# Patient Record
Sex: Female | Born: 1975 | Hispanic: Yes | Marital: Single | Smoking: Never smoker
Health system: Southern US, Community
[De-identification: ages and names within clinical notes are randomized; demographics above are authoritative.]

## PROBLEM LIST (undated history)

## (undated) ENCOUNTER — Inpatient Hospital Stay: Payer: Self-pay

## (undated) DIAGNOSIS — O4400 Placenta previa specified as without hemorrhage, unspecified trimester: Secondary | ICD-10-CM

## (undated) DIAGNOSIS — O34219 Maternal care for unspecified type scar from previous cesarean delivery: Secondary | ICD-10-CM

## (undated) HISTORY — DX: Maternal care for unspecified type scar from previous cesarean delivery: O34.219

## (undated) HISTORY — DX: Complete placenta previa nos or without hemorrhage, unspecified trimester: O44.00

---

## 2011-04-06 ENCOUNTER — Emergency Department: Payer: Self-pay | Admitting: Emergency Medicine

## 2013-08-27 ENCOUNTER — Emergency Department: Payer: Self-pay | Admitting: Internal Medicine

## 2015-08-24 LAB — OB RESULTS CONSOLE VARICELLA ZOSTER ANTIBODY, IGG: Varicella: IMMUNE

## 2015-08-24 LAB — OB RESULTS CONSOLE HEPATITIS B SURFACE ANTIGEN: HEP B S AG: NEGATIVE

## 2015-08-24 LAB — OB RESULTS CONSOLE ABO/RH: RH TYPE: POSITIVE

## 2015-08-24 LAB — OB RESULTS CONSOLE RPR: RPR: NONREACTIVE

## 2015-08-24 LAB — OB RESULTS CONSOLE RUBELLA ANTIBODY, IGM: RUBELLA: IMMUNE

## 2015-08-24 LAB — OB RESULTS CONSOLE GC/CHLAMYDIA
Chlamydia: NEGATIVE
Gonorrhea: NEGATIVE

## 2015-09-02 ENCOUNTER — Other Ambulatory Visit (HOSPITAL_COMMUNITY): Payer: Self-pay | Admitting: Urology

## 2015-09-02 DIAGNOSIS — Z3A19 19 weeks gestation of pregnancy: Secondary | ICD-10-CM

## 2015-09-02 DIAGNOSIS — Z3689 Encounter for other specified antenatal screening: Secondary | ICD-10-CM

## 2015-09-02 DIAGNOSIS — O09522 Supervision of elderly multigravida, second trimester: Secondary | ICD-10-CM

## 2015-09-16 ENCOUNTER — Encounter (HOSPITAL_COMMUNITY): Payer: Self-pay

## 2015-09-16 ENCOUNTER — Ambulatory Visit (HOSPITAL_COMMUNITY)
Admission: RE | Admit: 2015-09-16 | Discharge: 2015-09-16 | Disposition: A | Discharge status: Home / Self Care | Payer: Medicaid Other | Source: Ambulatory Visit | Attending: Physician Assistant | Admitting: Physician Assistant

## 2015-09-16 ENCOUNTER — Other Ambulatory Visit (HOSPITAL_COMMUNITY): Payer: Self-pay | Admitting: Urology

## 2015-09-16 ENCOUNTER — Ambulatory Visit (HOSPITAL_COMMUNITY): Admission: RE | Admit: 2015-09-16 | Payer: Medicaid Other | Source: Ambulatory Visit

## 2015-09-16 DIAGNOSIS — Z3A19 19 weeks gestation of pregnancy: Secondary | ICD-10-CM

## 2015-09-16 DIAGNOSIS — Z3689 Encounter for other specified antenatal screening: Secondary | ICD-10-CM

## 2015-09-16 DIAGNOSIS — O09522 Supervision of elderly multigravida, second trimester: Secondary | ICD-10-CM | POA: Insufficient documentation

## 2015-09-16 DIAGNOSIS — Z36 Encounter for antenatal screening of mother: Secondary | ICD-10-CM | POA: Insufficient documentation

## 2015-09-16 NOTE — ED Notes (Signed)
[employer] interpreter [phone]#[id number] accessed for pt care.

## 2015-09-17 ENCOUNTER — Other Ambulatory Visit (HOSPITAL_COMMUNITY): Payer: Self-pay | Admitting: Urology

## 2015-10-13 ENCOUNTER — Observation Stay: Payer: Medicaid Other

## 2015-10-13 ENCOUNTER — Observation Stay: Payer: Medicaid Other | Admitting: Anesthesiology

## 2015-10-13 ENCOUNTER — Encounter: Payer: Self-pay | Admitting: Obstetrics and Gynecology

## 2015-10-13 ENCOUNTER — Observation Stay
Admission: EM | Admit: 2015-10-13 | Discharge: 2015-10-14 | Disposition: A | Discharge status: Home / Self Care | Payer: Medicaid Other | Attending: General Surgery | Admitting: General Surgery

## 2015-10-13 ENCOUNTER — Encounter
Admission: EM | Disposition: A | Discharge status: Home / Self Care | Payer: Self-pay | Source: Home / Self Care | Attending: General Surgery

## 2015-10-13 DIAGNOSIS — O321XX0 Maternal care for breech presentation, not applicable or unspecified: Secondary | ICD-10-CM | POA: Diagnosis not present

## 2015-10-13 DIAGNOSIS — O4392 Unspecified placental disorder, second trimester: Secondary | ICD-10-CM | POA: Diagnosis not present

## 2015-10-13 DIAGNOSIS — R1013 Epigastric pain: Secondary | ICD-10-CM | POA: Insufficient documentation

## 2015-10-13 DIAGNOSIS — R0600 Dyspnea, unspecified: Secondary | ICD-10-CM | POA: Insufficient documentation

## 2015-10-13 DIAGNOSIS — N390 Urinary tract infection, site not specified: Secondary | ICD-10-CM | POA: Diagnosis not present

## 2015-10-13 DIAGNOSIS — B954 Other streptococcus as the cause of diseases classified elsewhere: Secondary | ICD-10-CM | POA: Diagnosis not present

## 2015-10-13 DIAGNOSIS — K567 Ileus, unspecified: Secondary | ICD-10-CM | POA: Insufficient documentation

## 2015-10-13 DIAGNOSIS — O4400 Placenta previa specified as without hemorrhage, unspecified trimester: Secondary | ICD-10-CM

## 2015-10-13 DIAGNOSIS — Z3A21 21 weeks gestation of pregnancy: Secondary | ICD-10-CM | POA: Diagnosis not present

## 2015-10-13 DIAGNOSIS — R112 Nausea with vomiting, unspecified: Secondary | ICD-10-CM | POA: Insufficient documentation

## 2015-10-13 DIAGNOSIS — R109 Unspecified abdominal pain: Secondary | ICD-10-CM

## 2015-10-13 DIAGNOSIS — R079 Chest pain, unspecified: Secondary | ICD-10-CM | POA: Diagnosis not present

## 2015-10-13 DIAGNOSIS — R1084 Generalized abdominal pain: Secondary | ICD-10-CM | POA: Diagnosis not present

## 2015-10-13 DIAGNOSIS — K3580 Unspecified acute appendicitis: Secondary | ICD-10-CM | POA: Diagnosis present

## 2015-10-13 DIAGNOSIS — R1010 Upper abdominal pain, unspecified: Secondary | ICD-10-CM

## 2015-10-13 DIAGNOSIS — O99612 Diseases of the digestive system complicating pregnancy, second trimester: Principal | ICD-10-CM | POA: Insufficient documentation

## 2015-10-13 DIAGNOSIS — O26899 Other specified pregnancy related conditions, unspecified trimester: Secondary | ICD-10-CM

## 2015-10-13 HISTORY — PX: APPENDECTOMY: SHX54

## 2015-10-13 HISTORY — DX: Complete placenta previa nos or without hemorrhage, unspecified trimester: O44.00

## 2015-10-13 LAB — CBC WITH DIFFERENTIAL/PLATELET
Basophils Absolute: 0.1 10*3/uL (ref 0–0.1)
Basophils Relative: 0 %
EOS ABS: 0.1 10*3/uL (ref 0–0.7)
Eosinophils Relative: 0 %
HEMATOCRIT: 38.3 % (ref 35.0–47.0)
HEMOGLOBIN: 13 g/dL (ref 12.0–16.0)
LYMPHS ABS: 1.6 10*3/uL (ref 1.0–3.6)
Lymphocytes Relative: 9 %
MCH: 29.5 pg (ref 26.0–34.0)
MCHC: 33.9 g/dL (ref 32.0–36.0)
MCV: 87 fL (ref 80.0–100.0)
MONO ABS: 0.8 10*3/uL (ref 0.2–0.9)
MONOS PCT: 5 %
NEUTROS ABS: 15.6 10*3/uL — AB (ref 1.4–6.5)
NEUTROS PCT: 86 %
Platelets: 392 10*3/uL (ref 150–440)
RBC: 4.4 MIL/uL (ref 3.80–5.20)
RDW: 14.1 % (ref 11.5–14.5)
WBC: 18.1 10*3/uL — ABNORMAL HIGH (ref 3.6–11.0)

## 2015-10-13 LAB — COMPREHENSIVE METABOLIC PANEL
ALK PHOS: 56 U/L (ref 38–126)
ALT: 15 U/L (ref 14–54)
ANION GAP: 7 (ref 5–15)
AST: 22 U/L (ref 15–41)
Albumin: 3.5 g/dL (ref 3.5–5.0)
BILIRUBIN TOTAL: 0.5 mg/dL (ref 0.3–1.2)
BUN: 6 mg/dL (ref 6–20)
CALCIUM: 8.9 mg/dL (ref 8.9–10.3)
CO2: 22 mmol/L (ref 22–32)
Chloride: 107 mmol/L (ref 101–111)
Creatinine, Ser: 0.44 mg/dL (ref 0.44–1.00)
GFR calc non Af Amer: >60 mL/min (ref >60)
Glucose, Bld: 125 mg/dL — ABNORMAL HIGH (ref 65–99)
Potassium: 3.2 mmol/L — ABNORMAL LOW (ref 3.5–5.1)
Sodium: 136 mmol/L (ref 135–145)
TOTAL PROTEIN: 7.8 g/dL (ref 6.5–8.1)

## 2015-10-13 LAB — URINALYSIS COMPLETE WITH MICROSCOPIC (ARMC ONLY)
BILIRUBIN URINE: NEGATIVE
GLUCOSE, UA: 50 mg/dL — AB
HGB URINE DIPSTICK: NEGATIVE
LEUKOCYTES UA: NEGATIVE
NITRITE: NEGATIVE
Protein, ur: 30 mg/dL — AB
Specific Gravity, Urine: 1.021 (ref 1.005–1.030)
pH: 5 (ref 5.0–8.0)

## 2015-10-13 LAB — HEMOGLOBIN A1C: Hgb A1c MFr Bld: 5.2 % (ref 4.0–6.0)

## 2015-10-13 LAB — LIPASE, BLOOD: Lipase: 35 U/L (ref 11–51)

## 2015-10-13 LAB — AMYLASE: Amylase: 190 U/L — ABNORMAL HIGH (ref 28–100)

## 2015-10-13 SURGERY — APPENDECTOMY
Anesthesia: Choice | Site: Abdomen | Wound class: Clean Contaminated

## 2015-10-13 MED ORDER — PROPOFOL 10 MG/ML IV BOLUS
INTRAVENOUS | Status: DC | PRN
  Administered 2015-10-13: 170 mg via INTRAVENOUS

## 2015-10-13 MED ORDER — ONDANSETRON HCL 4 MG/2ML IJ SOLN
4.0000 mg | Freq: Four times a day (QID) | INTRAMUSCULAR | Status: DC | PRN
  Administered 2015-10-13: 4 mg via INTRAVENOUS
  Filled 2015-10-13: qty 2

## 2015-10-13 MED ORDER — MORPHINE SULFATE (PF) 4 MG/ML IV SOLN
4.0000 mg | Freq: Once | INTRAVENOUS | Status: DC | PRN
Start: 2015-10-13 — End: 2015-10-13
  Administered 2015-10-13: 4 mg via INTRAVENOUS

## 2015-10-13 MED ORDER — ACETAMINOPHEN 325 MG RE SUPP: 650.0000 mg | Freq: Four times a day (QID) | RECTAL | Status: DC | PRN

## 2015-10-13 MED ORDER — LACTATED RINGERS IV SOLN
INTRAVENOUS | Status: DC
  Administered 2015-10-13 (×3): via INTRAVENOUS

## 2015-10-13 MED ORDER — SODIUM CHLORIDE 0.9 % IJ SOLN
INTRAMUSCULAR | Status: AC
  Administered 2015-10-13: 10 mL
  Filled 2015-10-13: qty 10

## 2015-10-13 MED ORDER — OXYCODONE HCL 5 MG PO TABS
5.0000 mg | ORAL_TABLET | ORAL | Status: DC | PRN
  Administered 2015-10-14: 5 mg via ORAL
  Filled 2015-10-13: qty 1

## 2015-10-13 MED ORDER — ACETAMINOPHEN 10 MG/ML IV SOLN
INTRAVENOUS | Status: DC | PRN
  Administered 2015-10-13: 1000 mg via INTRAVENOUS

## 2015-10-13 MED ORDER — PROMETHAZINE HCL 25 MG/ML IJ SOLN
INTRAMUSCULAR | Status: AC
  Administered 2015-10-13: 12.5 mg via INTRAVENOUS
  Filled 2015-10-13: qty 1

## 2015-10-13 MED ORDER — ONDANSETRON 4 MG PO TBDP
4.0000 mg | ORAL_TABLET | Freq: Four times a day (QID) | ORAL | Status: DC | PRN
  Filled 2015-10-13: qty 1

## 2015-10-13 MED ORDER — LACTATED RINGERS IV SOLN
INTRAVENOUS | Status: DC
Start: 2015-10-13 — End: 2015-10-14
  Administered 2015-10-14: 02:00:00 via INTRAVENOUS

## 2015-10-13 MED ORDER — NEOSTIGMINE METHYLSULFATE 10 MG/10ML IV SOLN
INTRAVENOUS | Status: DC | PRN
  Administered 2015-10-13: 3 mg via INTRAVENOUS

## 2015-10-13 MED ORDER — LIDOCAINE HCL (CARDIAC) 20 MG/ML IV SOLN
INTRAVENOUS | Status: DC | PRN
  Administered 2015-10-13: 100 mg via INTRAVENOUS

## 2015-10-13 MED ORDER — PROMETHAZINE HCL 25 MG/ML IJ SOLN
12.5000 mg | INTRAMUSCULAR | Status: DC | PRN
  Administered 2015-10-13: 12.5 mg via INTRAVENOUS

## 2015-10-13 MED ORDER — ROCURONIUM BROMIDE 100 MG/10ML IV SOLN
INTRAVENOUS | Status: DC | PRN
  Administered 2015-10-13: 30 mg via INTRAVENOUS

## 2015-10-13 MED ORDER — MORPHINE SULFATE (PF) 4 MG/ML IV SOLN
INTRAVENOUS | Status: AC
  Administered 2015-10-13: 4 mg via INTRAVENOUS
  Filled 2015-10-13: qty 1

## 2015-10-13 MED ORDER — MORPHINE SULFATE (PF) 2 MG/ML IV SOLN
2.0000 mg | INTRAVENOUS | Status: DC | PRN
  Administered 2015-10-13 – 2015-10-14 (×2): 2 mg via INTRAVENOUS
  Filled 2015-10-13 (×2): qty 1

## 2015-10-13 MED ORDER — MORPHINE SULFATE (PF) 4 MG/ML IV SOLN
4.0000 mg | INTRAVENOUS | Status: DC | PRN
  Administered 2015-10-13: 4 mg via INTRAVENOUS
  Filled 2015-10-13: qty 1

## 2015-10-13 MED ORDER — CEFAZOLIN SODIUM 1-5 GM-% IV SOLN
INTRAVENOUS | Status: DC | PRN
  Administered 2015-10-13: 1 g via INTRAVENOUS

## 2015-10-13 MED ORDER — SUCCINYLCHOLINE CHLORIDE 20 MG/ML IJ SOLN
INTRAMUSCULAR | Status: DC | PRN
  Administered 2015-10-13: 90 mg via INTRAVENOUS

## 2015-10-13 MED ORDER — FAMOTIDINE 20 MG PO TABS
20.0000 mg | ORAL_TABLET | Freq: Once | ORAL | Status: AC
  Administered 2015-10-13: 20 mg via ORAL
  Filled 2015-10-13: qty 1

## 2015-10-13 MED ORDER — ONDANSETRON HCL 4 MG/2ML IJ SOLN: 4.0000 mg | Freq: Four times a day (QID) | INTRAMUSCULAR | Status: DC | PRN

## 2015-10-13 MED ORDER — FENTANYL CITRATE (PF) 100 MCG/2ML IJ SOLN
INTRAMUSCULAR | Status: DC | PRN
  Administered 2015-10-13: 50 ug via INTRAVENOUS
  Administered 2015-10-13: 100 ug via INTRAVENOUS
  Administered 2015-10-13 (×2): 25 ug via INTRAVENOUS

## 2015-10-13 MED ORDER — IOHEXOL 350 MG/ML SOLN
75.0000 mL | Freq: Once | INTRAVENOUS | Status: AC | PRN
  Administered 2015-10-13: 75 mL via INTRAVENOUS

## 2015-10-13 MED ORDER — ACETAMINOPHEN 325 MG PO TABS
650.0000 mg | ORAL_TABLET | ORAL | Status: DC | PRN
  Administered 2015-10-13: 650 mg via ORAL
  Filled 2015-10-13: qty 2

## 2015-10-13 MED ORDER — FENTANYL CITRATE (PF) 100 MCG/2ML IJ SOLN
25.0000 ug | INTRAMUSCULAR | Status: DC | PRN
  Administered 2015-10-13 (×2): 25 ug via INTRAVENOUS

## 2015-10-13 MED ORDER — SODIUM CHLORIDE 0.9 % IV SOLN
1.0000 g | Freq: Once | INTRAVENOUS | Status: AC
  Administered 2015-10-13: 1 g via INTRAVENOUS
  Filled 2015-10-13 (×2): qty 1

## 2015-10-13 MED ORDER — ONDANSETRON HCL 4 MG/2ML IJ SOLN: 4.0000 mg | Freq: Once | INTRAMUSCULAR | Status: DC | PRN

## 2015-10-13 MED ORDER — GI COCKTAIL ~~LOC~~
30.0000 mL | Freq: Once | ORAL | Status: AC
  Administered 2015-10-13: 30 mL via ORAL
  Filled 2015-10-13: qty 30

## 2015-10-13 MED ORDER — ATROPINE SULFATE 0.4 MG/ML IJ SOLN
INTRAMUSCULAR | Status: DC | PRN
  Administered 2015-10-13: 1.2 mg via INTRAVENOUS

## 2015-10-13 MED ORDER — ACETAMINOPHEN 325 MG PO TABS: 650.0000 mg | ORAL_TABLET | Freq: Four times a day (QID) | ORAL | Status: DC | PRN

## 2015-10-13 SURGICAL SUPPLY — 27 items
CHLORAPREP W/TINT 26ML (MISCELLANEOUS) ×3 IMPLANT
CLOSURE WOUND 1/2 X4 (GAUZE/BANDAGES/DRESSINGS) ×1
CUTTER LINEAR ENDO 35 ART THIN (STAPLE) ×3 IMPLANT
DRAPE LAPAROTOMY TRNSV 106X77 (MISCELLANEOUS) ×3 IMPLANT
DRESSING TELFA 4X3 1S ST N-ADH (GAUZE/BANDAGES/DRESSINGS) ×3 IMPLANT
DRSG TEGADERM 4X4.75 (GAUZE/BANDAGES/DRESSINGS) ×3 IMPLANT
GLOVE BIO SURGEON STRL SZ7 (GLOVE) ×3 IMPLANT
GOWN STRL REUS W/ TWL LRG LVL3 (GOWN DISPOSABLE) ×2 IMPLANT
GOWN STRL REUS W/TWL LRG LVL3 (GOWN DISPOSABLE) ×4
KIT RM TURNOVER STRD PROC AR (KITS) ×3 IMPLANT
LABEL OR SOLS (LABEL) ×3 IMPLANT
PACK BASIN MINOR ARMC (MISCELLANEOUS) ×3 IMPLANT
PAD GROUND ADULT SPLIT (MISCELLANEOUS) ×3 IMPLANT
RELOAD CUTTER ETS 35MM STAND (ENDOMECHANICALS) ×3 IMPLANT
STRIP CLOSURE SKIN 1/2X4 (GAUZE/BANDAGES/DRESSINGS) ×2 IMPLANT
SUT MNCRL 3-0 VIOLET CT-1 (SUTURE) ×1 IMPLANT
SUT MONOCRYL 3-0 (SUTURE) ×2
SUT PDS AB 0 CT1 27 (SUTURE) ×6 IMPLANT
SUT PROLENE 0 CT 1 30 (SUTURE) ×6 IMPLANT
SUT SILK 4 0 SH (SUTURE) ×3 IMPLANT
SUT VIC AB 3-0 54X BRD REEL (SUTURE) ×1 IMPLANT
SUT VIC AB 3-0 BRD 54 (SUTURE) ×2
SUT VIC AB 3-0 SH 27 (SUTURE) ×2
SUT VIC AB 3-0 SH 27X BRD (SUTURE) ×1 IMPLANT
SUT VIC AB 4-0 FS2 27 (SUTURE) ×3 IMPLANT
SWAB CULTURE AMIES ANAERIB BLU (MISCELLANEOUS) ×3 IMPLANT
SWABSTK COMLB BENZOIN TINCTURE (MISCELLANEOUS) ×3 IMPLANT

## 2015-10-13 NOTE — Progress Notes (Signed)
S: Continues to have waves of abdominal pain and nausea, pain more mid abdomen  O: afebrile General : appears ill MRI reveals appendicitis. Still has a marginal previa  A: IUP at 25 4/7 weeks with appendicitis  P: Continue NPO Discussed results of MRI with patient via interpretor and discussed plan to consult general surgeon Consulted Dr Lemar LivingsByrnett who will be up to see patient  Farrel ConnersGUTIERREZ, Lasean Rahming, CNM

## 2015-10-13 NOTE — Anesthesia Postprocedure Evaluation (Signed)
  Anesthesia Post-op Note  Patient: Tara Pope  Procedure(s) Performed: Procedure(s): APPENDECTOMY (N/A)  Anesthesia type:No value filed.  Patient location: PACU  Post pain: Pain level controlled  Post assessment: Post-op Vital signs reviewed, Patient's Cardiovascular Status Stable, Respiratory Function Stable, Patent Airway and No signs of Nausea or vomiting  Post vital signs: Reviewed and stable  Last Vitals:  Filed Vitals:   10/13/15 2102  BP: 137/77  Pulse: 92  Temp: 37.1 C  Resp: 18    Level of consciousness: awake, alert  and patient cooperative  Complications: No apparent anesthesia complications

## 2015-10-13 NOTE — Progress Notes (Addendum)
OB Note  Patient received tylenol, GI cocktail and pepcid and states that upper belly pain to upper back pain is the same. Labs negative (CMP, U/A, lipase) except for elevated WBC and amylase and glucose seen on u/a nd CMP. D/w pt via interpreter and recommend CT PE and MRI A/P to assess for PE, aortic dissection and GI causes namely appendicitis. Risk of CT scan and contrast d/w pt. Will add on a1c to labs   Recent Labs Lab 10/13/15 0233  WBC 18.1*  HGB 13.0  HCT 38.3  PLT 392  NEUTOPHILPCT 86  LYMPHOPCT 9  MONOPCT 5  EOSPCT 0    Recent Labs Lab 10/13/15 0233  AMYLASE 190*     Tara Pope, Jr MD [facility]  Pager: [phone]

## 2015-10-13 NOTE — H&P (Addendum)
Obstetrics Admission History & Physical  10/13/2015 - 2:12 AM [facility]  Chief Complaint: upper abdominal pain since 1500 on 11/14  History of Present Illness  39 y.o. [id number] @ 25/4 (Dating: EDC 2/24, 21wk u/s), with the above CC. Pregnancy complicated by previa (right lateral), AMA, elevated BMI and h/o c-section x 2.  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 Last PO was chicken breast at 1800. Patient denies h/o prior pains, states pains are stable, has some nausea associated with it but no VB, LOF or decreased FM, dysuria, hematuria, chest pain. She does state that when she does have the pain it is harder to breathe  Review of Systems:  her 12 point review of systems is negative or as noted in the History of Present Illness.  PMHx:  No past medical history on file. PSHx:  Past Surgical History  Procedure Laterality Date  . Cesarean section      x2   Medications:  Prescriptions prior to admission  Medication Sig Dispense Refill Last Dose  . Prenatal Vit-Fe Fumarate-FA (PRENATAL VITAMIN PO) Take by mouth.   Taking     Allergies: has No Known Allergies. OBHx:  OB History  Gravida Para Term Preterm AB SAB TAB Ectopic Multiple Living  3 2 2       2     # Outcome Date GA Lbr Len/2nd Weight Sex Delivery Anes PTL Lv  3 Current           2 Term 02/23/09 [id number]    CS-Unspec     1 Term 03/21/02 546w0d    CS-Unspec        GYNHx: non contributory     FHx: No family history on file. Soc Hx:  Social History   Social History  . Marital Status: Single    Spouse Name: N/A  . Number of Children: N/A  . Years of Education: N/A   Occupational History  . Not on file.   Social History Main Topics  . Smoking status: Never Smoker   . Smokeless tobacco: Not on file  . Alcohol Use: No  . Drug Use: No  . Sexual Activity: Not on file   Other Topics Concern  . Not on file   Social History  Narrative    Objective    Current Vital Signs 24h Vital Sign Ranges  T 98.3 F (36.8 C) Temp  Avg: 98.3 F (36.8 C)  Min: 98.3 F (36.8 C)  Max: 98.3 F (36.8 C)  BP 104/62 mmHg BP  Min: 104/62  Max: 104/62  HR 84 Pulse  Avg: 84  Min: 84  Max: 84  RR (!) 22 Resp  Avg: 22  Min: 22  Max: 22  SaO2    100/RA No Data Recorded       24 Hour I/O Current Shift I/O  Time Ins Outs       EFM: 145 baseline, no accels or decels, mod variability  Toco: quiet  General: Well nourished, well developed female with waxing and waning discomfort Skin:  Warm and dry.  Cardiovascular: Regular rate and rhythm. Respiratory:  Clear to auscultation bilateral. Normal respiratory effort Abdomen: obese, soft, ND, gravid, ?murphy's, nttp in lower belly and upper belly Back: no CVAT Neuro/Psych:  Normal mood and affect.    Labs  none  Radiology As above   Assessment & Plan   39 y.o. [id number] @ 25/4  with abdominal pain. Pt currently stable. DDx includes GI, cardiac, pulmonary *IUP: too early in GA for formal NST but fetal status reassuring and not concerning for PTL or OB issues. Will leave toco on only *GI -RUQ u/s, ECG, CMP, CBC with diff, amylase, lipase, U/A and culture, NPO with MIVF. -if all negative, will do GI cocktail and PPI. If s/s still not improved, with do CT PE to assess for PE and dissection  Interview done with interpreter  Cornelia Copa MD [facility] Pager [phone]

## 2015-10-13 NOTE — Op Note (Signed)
Preop diagnosis: Acute appendicitis  Post op diagnosis: Same  Operation: Appendectomy  Surgeon: S.G.Nabria Nevin  Assistant:     Anesthesia: Gen.  Complications: None  EBL: Minimal  Drains: None  Description: Patient was brought the holding area and with the interpreter present discussed the appendectomy again. Her questions were answered. She was taken into the operating room where she was put to sleep. The abdomen was prepped and draped out in sterile field. Timeout was performed. Based on the location of pain which was in the upper midline and the MRI finding suggesting the presence of the appendix midway between the umbilicus and xiphoid is small vertical incision overlying this area was mapped out. Skin incision was made and deepened through the layers the abdominal wall into the peritoneal cavity using cautery for control of bleeding the omentum was identified and pushed back and with the careful dissection of careful exposure the appendix was found to have elevated going towards the posterior aspect. It was acutely inflamed in the distal three quarters and had some fibrinous exudate on the surface but no evidence of rupture. With careful exposure and limiting any kind of traction or pressure on the ureters the mesoappendix was first freed clamped cut and ligated with 3-0 Vicryl. The base the appendix was identified and it was a fairly long about 2-3 cm since length. This was then taken down with the use of a blue load of the stapling device. The appendix was subsequently sent to pathology. The omentum was placed back in its position in the abdomen closed. Fascia and peritoneum closed in single layer with interrupted figure-of-eight sutures of  0- PDS. The subcutaneous tissue was irrigated with some saline and then closed in 2 layers with 3-0 Vicryl. Skin was closed with subcuticular 4-0 Monocryl reinforced with Steri-Strips and tincture benzoin. Telfa and Tegaderm dressings placed. Patient  subsequently was extubated and returned recovery room stable condition

## 2015-10-13 NOTE — Progress Notes (Signed)
MRI and labs reviewed. Invanz ordered. Will review indications for appendix removal with patient.

## 2015-10-13 NOTE — Anesthesia Preprocedure Evaluation (Signed)
Anesthesia Evaluation  Patient identified by MRN, date of birth, ID band Patient awake    Reviewed: Allergy & Precautions, H&P , NPO status , Patient's Chart, lab work & pertinent test results, reviewed documented beta blocker date and time   History of Anesthesia Complications Negative for: history of anesthetic complications  Airway Mallampati: II  TM Distance: >3 FB Neck ROM: full    Dental  (+) Teeth Intact, Caps   Pulmonary neg pulmonary ROS,    Pulmonary exam normal breath sounds clear to auscultation       Cardiovascular Exercise Tolerance: Good negative cardio ROS Normal cardiovascular exam Rhythm:regular Rate:Normal     Neuro/Psych negative neurological ROS  negative psych ROS   GI/Hepatic negative GI ROS, Neg liver ROS,   Endo/Other  negative endocrine ROS  Renal/GU negative Renal ROS  negative genitourinary   Musculoskeletal   Abdominal   Peds  Hematology negative hematology ROS (+)   Anesthesia Other Findings History reviewed. No pertinent past medical history.   Reproductive/Obstetrics (+) Pregnancy                             Anesthesia Physical Anesthesia Plan  ASA: II  Anesthesia Plan: General, Rapid Sequence and Cricoid Pressure   Post-op Pain Management:    Induction:   Airway Management Planned:   Additional Equipment:   Intra-op Plan:   Post-operative Plan:   Informed Consent: I have reviewed the patients History and Physical, chart, labs and discussed the procedure including the risks, benefits and alternatives for the proposed anesthesia with the patient or authorized representative who has indicated his/her understanding and acceptance.   Dental Advisory Given  Plan Discussed with: Anesthesiologist, CRNA and Surgeon  Anesthesia Plan Comments:         Anesthesia Quick Evaluation

## 2015-10-13 NOTE — Transfer of Care (Signed)
Immediate Anesthesia Transfer of Care Note  Patient: Tara Pope  Procedure(s) Performed: Procedure(s): APPENDECTOMY (N/A)  Patient Location: PACU  Anesthesia Type:General  Level of Consciousness: awake, alert , oriented and patient cooperative  Airway & Oxygen Therapy: Patient Spontanous Breathing and Patient connected to face mask oxygen  Post-op Assessment: Report given to RN and Post -op Vital signs reviewed and stable  Post vital signs: Reviewed and stable  Last Vitals:  Filed Vitals:   10/13/15 1527  BP: 105/61  Pulse: 81  Temp: 37.1 C  Resp: 18    Complications: No apparent anesthesia complications

## 2015-10-13 NOTE — Anesthesia Procedure Notes (Signed)
Procedure Name: Intubation Date/Time: 10/13/2015 6:42 PM Performed by: Irving BurtonBACHICH, Deina Lipsey Pre-anesthesia Checklist: Patient identified, Patient being monitored, Timeout performed, Emergency Drugs available and Suction available Patient Re-evaluated:Patient Re-evaluated prior to inductionOxygen Delivery Method: Circle system utilized Preoxygenation: Pre-oxygenation with 100% oxygen Intubation Type: IV induction, Cricoid Pressure applied and Rapid sequence Laryngoscope Size: McGraph and 4 Tube type: Oral Tube size: 7.0 mm Number of attempts: 1 Airway Equipment and Method: Stylet Placement Confirmation: ETT inserted through vocal cords under direct vision,  positive ETCO2 and breath sounds checked- equal and bilateral Secured at: 22 cm Tube secured with: Tape Dental Injury: Teeth and Oropharynx as per pre-operative assessment

## 2015-10-13 NOTE — Consult Note (Signed)
Reason for Consult: Appendicitis Referring Physician: Dalia Heading, CNM  Tara Pope is an 39 y.o. female.  HPI: 39 year old woman gravida 3, para 2 now 21-[redacted] weeks pregnant. Prenatal care in [place]. Reports the onset of abdominal pain 24 hours ago. Worsening. Associated nausea and vomiting. Ultrasound/chest CT/abdominal MRI completed showing evidence of appendicitis. No previous symptoms.  History reviewed. No pertinent past medical history.  Past Surgical History  Procedure Laterality Date  . Cesarean section      x2    No family history on file.  Social History:  reports that she has never smoked. She does not have any smokeless tobacco history on file. She reports that she does not drink alcohol or use illicit drugs.  Allergies: No Known Allergies  Medications: I have reviewed the patient's current medications.  Results for orders placed or performed during the hospital encounter of 10/13/15 (from the past 48 hour(s))  Urinalysis complete, with microscopic ([facility] only)     Status: Abnormal   Collection Time: 10/13/15  2:33 AM  Result Value Ref Range   Color, Urine YELLOW (A) YELLOW   APPearance CLEAR (A) CLEAR   Glucose, UA 50 (A) NEGATIVE mg/dL   Bilirubin Urine NEGATIVE NEGATIVE   Ketones, ur 2+ (A) NEGATIVE mg/dL   Specific Gravity, Urine 1.021 1.005 - 1.030   Hgb urine dipstick NEGATIVE NEGATIVE   pH 5.0 5.0 - 8.0   Protein, ur 30 (A) NEGATIVE mg/dL   Nitrite NEGATIVE NEGATIVE   Leukocytes, UA NEGATIVE NEGATIVE   RBC / HPF 0-5 0 - 5 RBC/hpf   WBC, UA 0-5 0 - 5 WBC/hpf   Bacteria, UA FEW (A) NONE SEEN   Squamous Epithelial / LPF 0-5 (A) NONE SEEN   Mucous PRESENT    Hyaline Casts, UA PRESENT   CBC with Differential/Platelet     Status: Abnormal   Collection Time: 10/13/15  2:33 AM  Result Value Ref Range   WBC 18.1 (H) 3.6 - 11.0 K/uL   RBC 4.40 3.80 - 5.20 MIL/uL   Hemoglobin 13.0 12.0 - 16.0 g/dL   HCT 38.3 35.0 - 47.0 %   MCV 87.0 80.0 - 100.0 fL    MCH 29.5 26.0 - 34.0 pg   MCHC 33.9 32.0 - 36.0 g/dL   RDW 14.1 11.5 - 14.5 %   Platelets 392 150 - 440 K/uL   Neutrophils Relative % 86 %   Neutro Abs 15.6 (H) 1.4 - 6.5 K/uL   Lymphocytes Relative 9 %   Lymphs Abs 1.6 1.0 - 3.6 K/uL   Monocytes Relative 5 %   Monocytes Absolute 0.8 0.2 - 0.9 K/uL   Eosinophils Relative 0 %   Eosinophils Absolute 0.1 0 - 0.7 K/uL   Basophils Relative 0 %   Basophils Absolute 0.1 0 - 0.1 K/uL  Comprehensive metabolic panel     Status: Abnormal   Collection Time: 10/13/15  2:33 AM  Result Value Ref Range   Sodium 136 135 - 145 mmol/L   Potassium 3.2 (L) 3.5 - 5.1 mmol/L   Chloride 107 101 - 111 mmol/L   CO2 22 22 - 32 mmol/L   Glucose, Bld 125 (H) 65 - 99 mg/dL   BUN 6 6 - 20 mg/dL   Creatinine, Ser 0.44 0.44 - 1.00 mg/dL   Calcium 8.9 8.9 - 10.3 mg/dL   Total Protein 7.8 6.5 - 8.1 g/dL   Albumin 3.5 3.5 - 5.0 g/dL   AST 22 15 - 41 U/L  ALT 15 14 - 54 U/L   Alkaline Phosphatase 56 38 - 126 U/L   Total Bilirubin 0.5 0.3 - 1.2 mg/dL   GFR calc non Af Amer >60 >60 mL/min   GFR calc Af Amer >60 >60 mL/min    Comment: (NOTE) The eGFR has been calculated using the CKD EPI equation. This calculation has not been validated in all clinical situations. eGFR's persistently <60 mL/min signify possible Chronic Kidney Disease.    Anion gap 7 5 - 15  Lipase, blood     Status: None   Collection Time: 10/13/15  2:33 AM  Result Value Ref Range   Lipase 35 11 - 51 U/L  Amylase     Status: Abnormal   Collection Time: 10/13/15  2:33 AM  Result Value Ref Range   Amylase 190 (H) 28 - 100 U/L    Ct Angio Chest Pe W/cm &/or Wo Cm  10/13/2015  CLINICAL DATA:  39 year old female with upper abdominal pain since 3 p.m. yesterday while at work. Some difficulty breathing. Some radiation of pain from the upper abdomen into the upper back. EXAM: CT ANGIOGRAPHY CHEST WITH CONTRAST TECHNIQUE: Multidetector CT imaging of the chest was performed using the standard  protocol during bolus administration of intravenous contrast. Multiplanar CT image reconstructions and MIPs were obtained to evaluate the vascular anatomy. CONTRAST:  79m OMNIPAQUE IOHEXOL 350 MG/ML SOLN COMPARISON:  No priors. FINDINGS: Mediastinum/Lymph Nodes: No filling defects within the pulmonary arterial tree to suggest underlying pulmonary embolism. Heart size is normal. There is no significant pericardial fluid, thickening or pericardial calcification. No pathologically enlarged mediastinal or hilar lymph nodes. Esophagus is unremarkable in appearance. No axillary lymphadenopathy. Lungs/Pleura: No acute consolidative airspace disease. No pleural effusions. No suspicious appearing pulmonary nodules or masses. Upper Abdomen: Unremarkable. Musculoskeletal/Soft Tissues: There are no aggressive appearing lytic or blastic lesions noted in the visualized portions of the skeleton. Review of the MIP images confirms the above findings. IMPRESSION: 1. No evidence of pulmonary embolism. 2. No acute findings in the thorax to account for the patient's symptoms. Electronically Signed   By: DVinnie LangtonM.D.   On: 10/13/2015 08:08   Mr Pelvis Wo Contrast  10/13/2015  CLINICAL DATA:  Generalized abdominal pain with nausea and vomiting for 1 day. Twenty-five weeks pregnant. EXAM: MRI ABDOMEN AND PELVIS WITHOUT CONTRAST TECHNIQUE: Multiplanar multisequence MR imaging of the abdomen and pelvis was performed. No intravenous contrast was administered. COMPARISON:  None. FINDINGS: MRI ABDOMEN FINDINGS Lower chest: No acute findings. Hepatobiliary: No mass visualized on this unenhanced exam. Normal appearance of gallbladder. No evidence of biliary ductal dilatation. Pancreas: No mass or inflammatory process visualized on this unenhanced exam. Spleen:  Within normal limits in size. Adrenal Glands/Kidneys: No adrenal mass identified. No evidence of renal mass or hydronephrosis. Stomach/Bowel/Peritoneum: Dilated appendix is  seen measuring 14 mm in diameter which contains several out and ileus and shows mild periappendiceal inflammatory changes. The appendix is displaced superiorly into the lower abdomen by the gravid uterus. This is consistent with acute appendicitis. No evidence of abscess or bowel obstruction. Vascular/Lymphatic: No pathologically enlarged lymph nodes identified. No other significant abnormality identified. Other:  None. Musculoskeletal:  No suspicious bone lesions identified. MRI PELVIS FINDINGS Uterus: Single intrauterine fetus is seen in breech presentation. Marginal placenta previa noted. Cervix is closed. Right ovary: Appears normal.  No adnexal mass identified. Left ovary:  Appears normal.  No adnexal mass identified. Urinary Bladder:  Unremarkable. Lymph Nodes:  No pathologically enlarged lymph nodes  identified. Other: No free fluid visualized. IMPRESSION: Findings consistent with acute appendicitis. Appendix is displaced superiorly into the mid abdomen by the gravid uterus. No evidence of abscess or other complication. Single intrauterine fetus in breech presentation. Marginal placenta previa noted. These results will be called to the ordering clinician or representative by the Radiologist Assistant, and communication documented in the PACS or zVision Dashboard. Electronically Signed   By: Earle Gell M.D.   On: 10/13/2015 12:15   Mr Abdomen Wo Contrast  10/13/2015  CLINICAL DATA:  Generalized abdominal pain with nausea and vomiting for 1 day. Twenty-five weeks pregnant. EXAM: MRI ABDOMEN AND PELVIS WITHOUT CONTRAST TECHNIQUE: Multiplanar multisequence MR imaging of the abdomen and pelvis was performed. No intravenous contrast was administered. COMPARISON:  None. FINDINGS: MRI ABDOMEN FINDINGS Lower chest: No acute findings. Hepatobiliary: No mass visualized on this unenhanced exam. Normal appearance of gallbladder. No evidence of biliary ductal dilatation. Pancreas: No mass or inflammatory process  visualized on this unenhanced exam. Spleen:  Within normal limits in size. Adrenal Glands/Kidneys: No adrenal mass identified. No evidence of renal mass or hydronephrosis. Stomach/Bowel/Peritoneum: Dilated appendix is seen measuring 14 mm in diameter which contains several out and ileus and shows mild periappendiceal inflammatory changes. The appendix is displaced superiorly into the lower abdomen by the gravid uterus. This is consistent with acute appendicitis. No evidence of abscess or bowel obstruction. Vascular/Lymphatic: No pathologically enlarged lymph nodes identified. No other significant abnormality identified. Other:  None. Musculoskeletal:  No suspicious bone lesions identified. MRI PELVIS FINDINGS Uterus: Single intrauterine fetus is seen in breech presentation. Marginal placenta previa noted. Cervix is closed. Right ovary: Appears normal.  No adnexal mass identified. Left ovary:  Appears normal.  No adnexal mass identified. Urinary Bladder:  Unremarkable. Lymph Nodes:  No pathologically enlarged lymph nodes identified. Other: No free fluid visualized. IMPRESSION: Findings consistent with acute appendicitis. Appendix is displaced superiorly into the mid abdomen by the gravid uterus. No evidence of abscess or other complication. Single intrauterine fetus in breech presentation. Marginal placenta previa noted. These results will be called to the ordering clinician or representative by the Radiologist Assistant, and communication documented in the PACS or zVision Dashboard. Electronically Signed   By: Earle Gell M.D.   On: 10/13/2015 12:15   US Abdomen Limited Ruq  10/13/2015  CLINICAL DATA:  Acute onset of right lower quadrant abdominal pain. Initial encounter. EXAM: US ABDOMEN LIMITED - RIGHT UPPER QUADRANT COMPARISON:  None. FINDINGS: Gallbladder: No gallstones or wall thickening visualized. No sonographic Murphy sign noted. Common bile duct: Diameter: 0.3 cm, within normal limits in caliber. Liver:  No focal lesion identified. Within normal limits in parenchymal echogenicity. IMPRESSION: Unremarkable ultrasound of the right upper quadrant. Electronically Signed   By: Garald Balding M.D.   On: 10/13/2015 03:26    Review of Systems  Constitutional: Negative.   HENT: Negative.   Eyes: Negative.   Respiratory: Positive for cough.   Cardiovascular: Negative.   Gastrointestinal: Positive for abdominal pain.  Genitourinary: Negative.   Skin: Negative.    Blood pressure 111/62, pulse 72, temperature 98.3 F (36.8 C), temperature source Oral, resp. rate 18, last menstrual period 05/02/2015, SpO2 98 %. Physical Exam  Constitutional: She appears well-developed and well-nourished.  HENT:  Head: Normocephalic.  Eyes: Conjunctivae are normal.  Neck: Neck supple.  Cardiovascular: Normal rate and regular rhythm.   Respiratory: Effort normal and breath sounds normal.  GI: Normal appearance and bowel sounds are normal.     Laboratory/radiology:  MRI shows evidence of appendicitis. The appendix lies midway between the umbilicus and the xiphoid in the midline. 14 mm diameter. No evidence of rupture.   Laboratory studies shows white blood cell count of 18,000 with left shift.    Assessment/Plan: Impression: Acute appendicitis and pregnancy.  Risks of surgery reviewed with the patient through the interpreter, Loyda. Antibiotics initiated. Analgesics provided.   Surgery is later today and will be completed by Dr. Dellie Burns myself.  Robert Bellow 10/13/2015, 3:48 PM

## 2015-10-13 NOTE — Progress Notes (Signed)
Notified L&D pt out of surgery and needs fetal heart tone assessment.  L&D nurse used doppler to assess positive fetal heart tones. Pt resting in bed with eyes closed.  VSS, NAD.

## 2015-10-13 NOTE — Progress Notes (Signed)
Benign Gynecology Progress Note  Admission Date: 10/13/2015 Current Date: 10/13/2015  Tara Pope is a 39 y.o. G3P2002 HD#1 @ 8170w3d w abdominal pain .  History complicated by: Patient Active Problem List   Diagnosis Date Noted  . Placenta previa 10/13/2015  . Abdominal pain affecting pregnancy, antepartum 10/13/2015   Subjective:  Pt still has mid epigastric pain described as 10/10, radiates to mid back.  Nausea w 2 episodes of vomiting (one just now).  Had diarrhea last night and no BM today although feels like she needs to go but cannot. Meds no help.   Objective:   Filed Vitals:   10/13/15 0130 10/13/15 0708  BP: 104/62 104/59  Pulse: 84 71  Temp: 98.3 F (36.8 C) 98.7 F (37.1 C)  TempSrc: Oral Oral  Resp: 22 18  SpO2:  98%   Temp:  [98.3 F (36.8 C)-98.7 F (37.1 C)] 98.7 F (37.1 C) (11/15 0708) Pulse Rate:  [71-84] 71 (11/15 0708) Resp:  [18-22] 18 (11/15 0708) BP: (104)/(59-62) 104/59 mmHg (11/15 0708) SpO2:  [98 %] 98 % (11/15 0708)   Total I/O In: 125 [I.V.:125] Out: -   Intake/Output Summary (Last 24 hours) at 10/13/15 0829 Last data filed at 10/13/15 0800  Gross per 24 hour  Intake    125 ml  Output      0 ml  Net    125 ml     Current Vital Signs 24h Vital Sign Ranges  T 98.7 F (37.1 C) Temp  Avg: 98.5 F (36.9 C)  Min: 98.3 F (36.8 C)  Max: 98.7 F (37.1 C)  BP (!) 104/59 mmHg BP  Min: 104/59  Max: 104/59  HR 71 Pulse  Avg: 77.5  Min: 71  Max: 84  RR 18 Resp  Avg: 20  Min: 18  Max: 22  SaO2 98 %   SpO2  Avg: 98 %  Min: 98 %  Max: 98 %           24 Hour I/O Current Shift I/O  Time Ins Outs   11/15 0701 - 11/15 1900 In: 125 [I.V.:125] Out: -    Physical exam: General appearance: alert, cooperative and mild distress Abdomen: tender upper abdomen, mid epigastric area. GU: No gross VB Extremities: no edema Skin: no lesions Psych: appropriate FHT 130s   Recent Labs Lab 10/13/15 0233  NA 136  K 3.2*  CL 107  CO2 22   BUN 6  CREATININE 0.44  GLUCOSE 125*    Recent Labs Lab 10/13/15 0233  WBC 18.1*  HGB 13.0  HCT 38.3  PLT 392     Recent Labs Lab 10/13/15 0233  CALCIUM 8.9   No results for input(s): INR, APTT in the last 168 hours.     Recent Labs Lab 10/13/15 0233  ALKPHOS 56  BILITOT 0.5  PROT 7.8  ALT 15  AST 22    Radiology CT just done, reported as normal chest w no PE, no other etiology found for her pain US earlier, normal gall bladder  Assessment & Plan:  Epigastric pain, mod-severe.    Gastroenteritis, although no diarrhea today (once last night)    Appendicitis, pancreatic abnormality are possibilities  MRI today for dx of more concerning etiology IVF, Zofran, symptomatic control of sx's FWR

## 2015-10-14 ENCOUNTER — Encounter: Payer: Self-pay | Admitting: General Surgery

## 2015-10-14 ENCOUNTER — Encounter: Payer: Self-pay | Admitting: *Deleted

## 2015-10-14 DIAGNOSIS — O99612 Diseases of the digestive system complicating pregnancy, second trimester: Secondary | ICD-10-CM | POA: Diagnosis not present

## 2015-10-14 LAB — CBC
HEMATOCRIT: 31.2 % — AB (ref 35.0–47.0)
HEMOGLOBIN: 10.8 g/dL — AB (ref 12.0–16.0)
MCH: 30.3 pg (ref 26.0–34.0)
MCHC: 34.7 g/dL (ref 32.0–36.0)
MCV: 87.4 fL (ref 80.0–100.0)
PLATELETS: 318 10*3/uL (ref 150–440)
RBC: 3.57 MIL/uL — ABNORMAL LOW (ref 3.80–5.20)
RDW: 14.7 % — ABNORMAL HIGH (ref 11.5–14.5)
WBC: 15.1 10*3/uL — ABNORMAL HIGH (ref 3.6–11.0)

## 2015-10-14 LAB — URINE CULTURE: Special Requests: NORMAL

## 2015-10-14 MED ORDER — OXYCODONE HCL 5 MG PO TABS: 5.0000 mg | ORAL_TABLET | ORAL | Status: DC | PRN

## 2015-10-14 NOTE — Discharge Summary (Signed)
Physician Discharge Summary  Patient ID: Tara Pope MRN: [id number] DOB/AGE: 38/03/1976 38 y.o.  Admit date: 10/13/2015 Discharge date: 10/14/2015  Admission Diagnoses: Abdominal pain  Discharge Diagnoses:  Active Problems:   Placenta previa   Abdominal pain affecting pregnancy, antepartum   Acute appendicitis   Discharged Condition: good  Hospital Course: This 39 year old female currently about [redacted] weeks  pregnant presented with less than 1 day history of abdominal pain localized in the mid upper abdomen. She underwent evaluation with an MRI which showed evidence of appendicitis with the appendix lying in the midline area and a transverse orientation in the epigastric region. A pregnancy was noted be progressing and was not affected by this at the time of her presentation. Surgical consultation was obtained. Patient subsequently was taken to the operating room on 10/13/2015 and underwent appendectomy. She had acute appendicitis without any evidence of rupture or other complications. Patient remained stable overnight in observation with no nausea vomiting fever or other symptoms. Patient is now being discharged to be followed as an outpatient.  Consults: general surgery  Significant Diagnostic Studies: radiology: MRI:  abdomen  Treatments: surgery: Appendectomy  Discharge Exam: Blood pressure 98/58, pulse 77, temperature 98.5 F (36.9 C), temperature source Oral, resp. rate 20, last menstrual period 05/02/2015, SpO2 98 %. General appearance: alert and no distress Abdomen is soft, Dressing intact and dry. WBC trending down.  Disposition: 01-Home or Self Care  Discharge Instructions    Diet general    Complete by:  As directed      Discharge instructions    Complete by:  As directed   No exertional activity.  Return to work in about 4 weeks after follow up assessment May shower. Remove outer dressing in 2-3 days            Medication List    TAKE these medications        oxyCODONE 5 MG immediate release tablet  Commonly known as:  Oxy IR/ROXICODONE  Take 1-2 tablets (5-10 mg total) by mouth every 4 (four) hours as needed for moderate pain.           Follow-up Information    Follow up with Kieth BrightlySANKAR,SEEPLAPUTHUR G, MD. Schedule an appointment as soon as possible for a visit on 10/20/2015.   Specialties:  General Surgery, Radiology   Why:  Follow-up appointment 11/22 at 4:15pm    Contact information:   [address] [place] [address] [phone]       Signed: Kieth BrightlySANKAR,SEEPLAPUTHUR G 10/14/2015, 6:02 PM

## 2015-10-14 NOTE — Final Progress Note (Signed)
  October 14, 2015  Patient: Tara Pope  Date of Birth: 12/30/1975  Date of Visit: 10/13/2015    To Whom It May Concern:  Tara Pope was seen and treated in our hospital on 10/13/2015. Tara Pope  may return to work on 11/11/15.  Sincerely,

## 2015-10-14 NOTE — Progress Notes (Signed)
Interpreter Otto at bedside to review D/C instructions. Answered all questions and Pt verbalizes understanding.

## 2015-10-14 NOTE — Progress Notes (Signed)
Benign Gynecology Progress Note  Admission Date: 10/13/2015 Current Date: 10/14/2015  Roselyn ReefRosa E Cumby is a 39 y.o. [id number] HD#1 @ 3356w5d with appendicitis diagnosed.   History complicated by: Patient Active Problem List   Diagnosis Date Noted  . Placenta previa 10/13/2015  . Abdominal pain affecting pregnancy, antepartum 10/13/2015  . Acute appendicitis 10/13/2015   ROS and patient/family/surgical history, located on admission H&P note dated 10/13/2015, have been reviewed, and there are no changes except as noted below  Yesterday/Overnight Events:  Surgery last night.   Subjective:  Pt reports pain in upper abdomen, although improved from yesterday. +FM.   Objective:   Filed Vitals:   10/13/15 2206 10/13/15 2307 10/14/15 0009 10/14/15 0421  BP: 113/69 109/65 108/59 96/60  Pulse: 92 90 80   Temp: 98.5 F (36.9 C) 99.1 F (37.3 C) 98.8 F (37.1 C) 98.2 F (36.8 C)  TempSrc: Oral Oral Oral Oral  Resp: 18 18 18 18   SpO2: 97% 97% 98%   I/O last 3 completed shifts: In: 2625 [I.V.:2575; IV Piggyback:50] Out: 250 [Urine:250]      Current Vital Signs 24h Vital Sign Ranges  T 98.2 F (36.8 C) Temp  Avg: 98.9 F (37.2 C)  Min: 98.2 F (36.8 C)  Max: 100.1 F (37.8 C)  BP 96/60 mmHg BP  Min: 96/60  Max: 137/77  HR 80 Pulse  Avg: 84.5  Min: 72  Max: 92  RR 18 Resp  Avg: 18  Min: 18  Max: 18  SaO2 98 % Not Delivered SpO2  Avg: 97.3 %  Min: 97 %  Max: 98 %           24 Hour I/O Current Shift I/O  Time Ins Outs 11/15 0701 - 11/16 0700 In: 2625 [I.V.:2575] Out: 250 [Urine:250]      Physical exam: General appearance: alert, cooperative and no distress Abdomen: soft, min T, ND, gravid, uterus NT GU: No gross VB Extremities: no redness or tenderness in the calves or thighs, no edema Skin: no lesions Psych: appropriate  Recent Labs Lab 10/13/15 0233 10/14/15 0625  WBC 18.1* 15.1*  HGB 13.0 10.8*  HCT 38.3 31.2*  PLT 392 318    Recent Labs Lab  10/13/15 0233  NA 136  K 3.2*  CL 107  CO2 22  BUN 6  CREATININE 0.44  CALCIUM 8.9  PROT 7.8  BILITOT 0.5  ALKPHOS 56  ALT 15  AST 22  GLUCOSE 125*    Assessment & Plan:  Pregnant 25 6/7 weeks s/p appendectomy for acute appendicitis. FWR [place] from hospital. Monitor for pain or bleeding, or for nausea or diarrhea or fever. Defer to Gen Surgery for d/c planning.

## 2015-10-15 LAB — SURGICAL PATHOLOGY

## 2015-10-20 ENCOUNTER — Ambulatory Visit (INDEPENDENT_AMBULATORY_CARE_PROVIDER_SITE_OTHER): Payer: Medicaid Other | Admitting: General Surgery

## 2015-10-20 ENCOUNTER — Encounter: Payer: Self-pay | Admitting: General Surgery

## 2015-10-20 VITALS — BP 130/70 | HR 76 | Resp 12 | Ht 65.0 in | Wt 183.0 lb

## 2015-10-20 DIAGNOSIS — K3589 Other acute appendicitis without perforation or gangrene: Secondary | ICD-10-CM

## 2015-10-20 NOTE — Patient Instructions (Addendum)
Follow up in 2-3 weeks. Gradually increase activity.

## 2015-10-20 NOTE — Progress Notes (Signed)
Here today for her postoperative visit, appendectomy done on 10/13/15. Patient states she is doing well. No n/v. Pain control is good.Bowels working ok. I have reviewed the history of present illness with the patient.  The appendix was acutely inflamed and gangrenous without rupture. She was noted to have the appendix in the mid epigastric midline region. Surgery was uneventful and she went home the next day.  Lungs clear, HRR, bowel sounds present. Incision in mid epigastrium is clean and healing well.  Interpreter, Elta [place], present for interview, exam and discussion.  Follow up in 2-3 weeks. No exertional activity    PCP:  No Pcp

## 2015-11-10 ENCOUNTER — Other Ambulatory Visit (HOSPITAL_COMMUNITY): Payer: Self-pay | Admitting: Nurse Practitioner

## 2015-11-10 ENCOUNTER — Encounter: Payer: Self-pay | Admitting: General Surgery

## 2015-11-10 ENCOUNTER — Ambulatory Visit (INDEPENDENT_AMBULATORY_CARE_PROVIDER_SITE_OTHER): Payer: Self-pay | Admitting: General Surgery

## 2015-11-10 VITALS — BP 124/72 | HR 88 | Resp 14 | Ht 65.0 in | Wt 186.0 lb

## 2015-11-10 DIAGNOSIS — K3589 Other acute appendicitis without perforation or gangrene: Secondary | ICD-10-CM

## 2015-11-10 DIAGNOSIS — Z3689 Encounter for other specified antenatal screening: Secondary | ICD-10-CM

## 2015-11-10 NOTE — Progress Notes (Addendum)
Here today for her postoperative visit, appendectomy done on 10/13/15. Patient states she is doing well, occasional pains but not requiring any pain medications. Pregnancy is going well, she is 28 weeks. Interpreter, Claretha CooperLoyda, present for interview, exam and discussion. I have reviewed the history of present illness with the patient.  Incision is clean and well healed.  Lungs are clear and abdomen is soft. Uterus in mid epigastrium   Patient to return as needed.  The patient is aware to call back for any questions or concerns.   PCP:  No Pcp

## 2015-11-10 NOTE — Patient Instructions (Addendum)
Patient to return as needed. The patient is aware to call back for any questions or concerns. 

## 2015-11-29 HISTORY — DX: Maternal care for unspecified type scar from previous cesarean delivery: O34.219

## 2015-12-01 ENCOUNTER — Other Ambulatory Visit (HOSPITAL_COMMUNITY): Payer: Self-pay | Admitting: Nurse Practitioner

## 2015-12-01 ENCOUNTER — Ambulatory Visit (HOSPITAL_COMMUNITY)
Admission: RE | Admit: 2015-12-01 | Discharge: 2015-12-01 | Disposition: A | Discharge status: Home / Self Care | Payer: Medicaid Other | Source: Ambulatory Visit | Attending: Nurse Practitioner | Admitting: Nurse Practitioner

## 2015-12-01 DIAGNOSIS — O34219 Maternal care for unspecified type scar from previous cesarean delivery: Secondary | ICD-10-CM

## 2015-12-01 DIAGNOSIS — Z3689 Encounter for other specified antenatal screening: Secondary | ICD-10-CM

## 2015-12-01 DIAGNOSIS — O4403 Placenta previa specified as without hemorrhage, third trimester: Secondary | ICD-10-CM

## 2015-12-01 DIAGNOSIS — Z3A32 32 weeks gestation of pregnancy: Secondary | ICD-10-CM | POA: Insufficient documentation

## 2015-12-01 DIAGNOSIS — O4413 Placenta previa with hemorrhage, third trimester: Secondary | ICD-10-CM | POA: Insufficient documentation

## 2015-12-25 ENCOUNTER — Other Ambulatory Visit (HOSPITAL_COMMUNITY): Payer: Self-pay | Admitting: Nurse Practitioner

## 2015-12-25 DIAGNOSIS — O4403 Placenta previa specified as without hemorrhage, third trimester: Secondary | ICD-10-CM

## 2015-12-28 ENCOUNTER — Ambulatory Visit (HOSPITAL_COMMUNITY)
Admission: RE | Admit: 2015-12-28 | Discharge: 2015-12-28 | Disposition: A | Discharge status: Home / Self Care | Payer: Medicaid Other | Source: Ambulatory Visit | Attending: Nurse Practitioner | Admitting: Nurse Practitioner

## 2015-12-28 ENCOUNTER — Other Ambulatory Visit (HOSPITAL_COMMUNITY): Payer: Self-pay | Admitting: Nurse Practitioner

## 2015-12-28 DIAGNOSIS — O34219 Maternal care for unspecified type scar from previous cesarean delivery: Secondary | ICD-10-CM

## 2015-12-28 DIAGNOSIS — Z3A36 36 weeks gestation of pregnancy: Secondary | ICD-10-CM

## 2015-12-28 DIAGNOSIS — O09529 Supervision of elderly multigravida, unspecified trimester: Secondary | ICD-10-CM

## 2015-12-28 DIAGNOSIS — O09523 Supervision of elderly multigravida, third trimester: Secondary | ICD-10-CM

## 2015-12-28 DIAGNOSIS — O4403 Placenta previa specified as without hemorrhage, third trimester: Secondary | ICD-10-CM | POA: Insufficient documentation

## 2015-12-28 NOTE — H&P (Signed)
  Tara Pope is an 40 y.o. G45P2002 [redacted]w[redacted]d female.   Chief Complaint: Previous C-section x 2, placenta previa HPI: needs repeat C-section at 37 wks due to placenta previa  No past medical history on file.  Past Surgical History  Procedure Laterality Date  . Cesarean section      x2  . Appendectomy N/A 10/13/2015    Procedure: APPENDECTOMY;  Surgeon: Kieth Brightly, MD;  Location: [facility] ORS;  Service: General;  Laterality: N/A;    No family history on file. Social History:  reports that she has never smoked. She does not have any smokeless tobacco history on file. She reports that she does not drink alcohol or use illicit drugs.   No Known Allergies  No current facility-administered medications on file prior to encounter.   Current Outpatient Prescriptions on File Prior to Encounter  Medication Sig Dispense Refill  . Prenatal Vit-Fe Fumarate-FA (MULTIVITAMIN-PRENATAL) 27-0.8 MG TABS tablet Take 1 tablet by mouth daily at 12 noon.      A comprehensive review of systems was negative.  Last menstrual period 05/02/2015. LMP 05/02/2015  Physical Examination: General appearance - alert, well appearing, and in no distress Neck - supple, no significant adenopathy Chest - normal effort Heart - normal rate, regular rhythm, normal S1, S2, no murmurs, rubs, clicks or gallops Abdomen - soft, nontender, nondistended, no masses or organomegaly Neurological - alert, oriented, normal speech, no focal findings or movement disorder noted Extremities - peripheral pulses normal, no pedal edema, no clubbing or cyanosis Skin - normal coloration and turgor, no rashes, no suspicious skin lesions noted    Lab Results  Component Value Date   WBC 15.1* 10/14/2015   HGB 10.8* 10/14/2015   HCT 31.2* 10/14/2015   MCV 87.4 10/14/2015   PLT 318 10/14/2015         ABO, Rh: O/Positive/-- (09/26 0000)  Antibody:    Rubella: !Error!  RPR: Nonreactive (09/26 0000)  HBsAg: Negative (09/26  0000)  HIV:    GBS:       Assessment/Plan Patient Active Problem List   Diagnosis Date Noted  . Previous cesarean delivery, antepartum condition or complication 12/28/2015  . AMA (advanced maternal age) multigravida 35+ 12/28/2015  . Placenta previa 10/13/2015   For RLTCS at 37 wks due to previa. Risks include but are not limited to bleeding, infection, injury to surrounding structures, including bowel, bladder and ureters, blood clots, and death.  Likelihood of success is high.   Tara Pope S 12/28/2015, 10:04 AM

## 2015-12-30 NOTE — Patient Instructions (Signed)
Instrucciones:  Su cirugia esta programada para-( your procedure is scheduled on) : Friday, Jan 01, 2016  Entre por la entrada principal a la(s) -(enter through the main entrance at): 7:15 A.M.  [address] telefono,  [place] [id number] e informenos de su llegada ( pick up phone, dial [phone] on arrival)  Por favor llame al [phone] si tiene algun problema la [place] ( please call [phone] if you have any problems the morning of surgery.)  Recuerde: (Remember)  No coma alimentos ni tome liquidos, incluyendo agua, despues de la medianoche del  ( Do not eat food or drink liquids including water after midnight on Thursday  Tome estas medicinas la [place] de la cirugia con un sorbito de agua (take these meds the morning of surgery with a SIP of water) None  Puede cepillarse los dientes en la manana de la [place]. (you may brush your teeth the morning of surgery)  NO use joyas, maquillaje de ojos, lapiz labial, crema para el cuerpo o esmalte de unas oscuro - las unas de los pies pueden estar pintados. ( Do not wear jewelry, eye makeup, lipstick, body lotion, or dark fingernail polish)  Puede usar desodorante ( you may wear deodorant)  Si va a ser ingresado despues de las [place], deje la [place] en el carro hasta que se le haya asignado una habitacion. ( If you are to be admitted after surgery, leave suitcase in car until your room has been assigned.)  Use ropa suelta y comoda de regreso a Technical sales engineer. ( wear loose comfortable clothes for ride home)  Firma del paciente (patient signature) ______________________________________

## 2015-12-31 ENCOUNTER — Encounter (HOSPITAL_COMMUNITY)
Admission: RE | Admit: 2015-12-31 | Discharge: 2015-12-31 | Disposition: A | Discharge status: Home / Self Care | Payer: Medicaid Other | Source: Ambulatory Visit | Attending: Family Medicine | Admitting: Family Medicine

## 2015-12-31 ENCOUNTER — Encounter (HOSPITAL_COMMUNITY): Payer: Self-pay

## 2015-12-31 LAB — CBC
HEMATOCRIT: 35.7 % — AB (ref 36.0–46.0)
HEMOGLOBIN: 12.4 g/dL (ref 12.0–15.0)
MCH: 30.4 pg (ref 26.0–34.0)
MCHC: 34.7 g/dL (ref 30.0–36.0)
MCV: 87.5 fL (ref 78.0–100.0)
Platelets: 315 10*3/uL (ref 150–400)
RBC: 4.08 MIL/uL (ref 3.87–5.11)
RDW: 14.9 % (ref 11.5–15.5)
WBC: 10.1 10*3/uL (ref 4.0–10.5)

## 2015-12-31 LAB — ABO/RH: ABO/RH(D): O POS

## 2016-01-01 ENCOUNTER — Inpatient Hospital Stay (HOSPITAL_COMMUNITY): Payer: Medicaid Other | Admitting: Anesthesiology

## 2016-01-01 ENCOUNTER — Encounter (HOSPITAL_COMMUNITY): Payer: Self-pay | Admitting: *Deleted

## 2016-01-01 ENCOUNTER — Inpatient Hospital Stay (HOSPITAL_COMMUNITY)
Admission: RE | Admit: 2016-01-01 | Discharge: 2016-01-03 | DRG: 765 | Disposition: A | Discharge status: Home / Self Care | Payer: Medicaid Other | Source: Ambulatory Visit | Attending: Obstetrics & Gynecology | Admitting: Obstetrics & Gynecology

## 2016-01-01 ENCOUNTER — Encounter (HOSPITAL_COMMUNITY)
Admission: RE | Disposition: A | Discharge status: Home / Self Care | Payer: Self-pay | Source: Ambulatory Visit | Attending: Family Medicine

## 2016-01-01 DIAGNOSIS — O4400 Placenta previa specified as without hemorrhage, unspecified trimester: Secondary | ICD-10-CM | POA: Diagnosis present

## 2016-01-01 DIAGNOSIS — O4403 Placenta previa specified as without hemorrhage, third trimester: Secondary | ICD-10-CM | POA: Diagnosis present

## 2016-01-01 DIAGNOSIS — O34211 Maternal care for low transverse scar from previous cesarean delivery: Principal | ICD-10-CM | POA: Diagnosis present

## 2016-01-01 DIAGNOSIS — Z3A37 37 weeks gestation of pregnancy: Secondary | ICD-10-CM | POA: Diagnosis not present

## 2016-01-01 DIAGNOSIS — O34219 Maternal care for unspecified type scar from previous cesarean delivery: Secondary | ICD-10-CM | POA: Diagnosis present

## 2016-01-01 DIAGNOSIS — O09529 Supervision of elderly multigravida, unspecified trimester: Secondary | ICD-10-CM

## 2016-01-01 LAB — PREPARE RBC (CROSSMATCH)

## 2016-01-01 LAB — RPR: RPR: NONREACTIVE

## 2016-01-01 SURGERY — Surgical Case
Anesthesia: Spinal

## 2016-01-01 MED ORDER — BUPIVACAINE IN DEXTROSE 0.75-8.25 % IT SOLN
INTRATHECAL | Status: DC | PRN
  Administered 2016-01-01: 1.4 mL via INTRATHECAL

## 2016-01-01 MED ORDER — NALBUPHINE HCL 10 MG/ML IJ SOLN: 5.0000 mg | Freq: Once | INTRAMUSCULAR | Status: DC | PRN

## 2016-01-01 MED ORDER — SCOPOLAMINE 1 MG/3DAYS TD PT72: 1.0000 | MEDICATED_PATCH | Freq: Once | TRANSDERMAL | Status: DC

## 2016-01-01 MED ORDER — DIBUCAINE 1 % RE OINT: 1.0000 "application " | TOPICAL_OINTMENT | RECTAL | Status: DC | PRN

## 2016-01-01 MED ORDER — SIMETHICONE 80 MG PO CHEW
80.0000 mg | CHEWABLE_TABLET | Freq: Three times a day (TID) | ORAL | Status: DC
  Administered 2016-01-02 – 2016-01-03 (×4): 80 mg via ORAL
  Filled 2016-01-01 (×5): qty 1

## 2016-01-01 MED ORDER — MEPERIDINE HCL 25 MG/ML IJ SOLN: 6.2500 mg | INTRAMUSCULAR | Status: DC | PRN

## 2016-01-01 MED ORDER — CEFAZOLIN SODIUM-DEXTROSE 2-3 GM-% IV SOLR
2.0000 g | INTRAVENOUS | Status: AC
  Administered 2016-01-01: 2 g via INTRAVENOUS

## 2016-01-01 MED ORDER — PHENYLEPHRINE HCL 10 MG/ML IJ SOLN
INTRAMUSCULAR | Status: DC | PRN
  Administered 2016-01-01: 40 ug via INTRAVENOUS

## 2016-01-01 MED ORDER — KETOROLAC TROMETHAMINE 30 MG/ML IJ SOLN: 30.0000 mg | Freq: Four times a day (QID) | INTRAMUSCULAR | Status: AC | PRN

## 2016-01-01 MED ORDER — MORPHINE SULFATE (PF) 0.5 MG/ML IJ SOLN
INTRAMUSCULAR | Status: DC | PRN
  Administered 2016-01-01: .2 mg via EPIDURAL

## 2016-01-01 MED ORDER — CEFAZOLIN SODIUM-DEXTROSE 2-3 GM-% IV SOLR
INTRAVENOUS | Status: AC
  Filled 2016-01-01: qty 50

## 2016-01-01 MED ORDER — DIPHENHYDRAMINE HCL 25 MG PO CAPS: 25.0000 mg | ORAL_CAPSULE | Freq: Four times a day (QID) | ORAL | Status: DC | PRN

## 2016-01-01 MED ORDER — ONDANSETRON HCL 4 MG/2ML IJ SOLN
INTRAMUSCULAR | Status: AC
  Filled 2016-01-01: qty 2

## 2016-01-01 MED ORDER — SODIUM CHLORIDE 0.9% FLUSH: 3.0000 mL | INTRAVENOUS | Status: DC | PRN

## 2016-01-01 MED ORDER — NALBUPHINE HCL 10 MG/ML IJ SOLN: 5.0000 mg | INTRAMUSCULAR | Status: DC | PRN

## 2016-01-01 MED ORDER — OXYCODONE-ACETAMINOPHEN 5-325 MG PO TABS
1.0000 | ORAL_TABLET | ORAL | Status: DC | PRN
  Administered 2016-01-02 – 2016-01-03 (×5): 1 via ORAL
  Filled 2016-01-01 (×5): qty 1

## 2016-01-01 MED ORDER — ONDANSETRON HCL 4 MG/2ML IJ SOLN: 4.0000 mg | Freq: Three times a day (TID) | INTRAMUSCULAR | Status: DC | PRN

## 2016-01-01 MED ORDER — LACTATED RINGERS IV SOLN
INTRAVENOUS | Status: DC | PRN
  Administered 2016-01-01: 09:00:00 via INTRAVENOUS

## 2016-01-01 MED ORDER — LACTATED RINGERS IV SOLN: INTRAVENOUS | Status: DC

## 2016-01-01 MED ORDER — DIPHENHYDRAMINE HCL 50 MG/ML IJ SOLN: 12.5000 mg | INTRAMUSCULAR | Status: DC | PRN

## 2016-01-01 MED ORDER — FENTANYL CITRATE (PF) 100 MCG/2ML IJ SOLN
INTRAMUSCULAR | Status: AC
  Filled 2016-01-01: qty 2

## 2016-01-01 MED ORDER — FENTANYL CITRATE (PF) 100 MCG/2ML IJ SOLN: 25.0000 ug | INTRAMUSCULAR | Status: DC | PRN

## 2016-01-01 MED ORDER — PHENYLEPHRINE 8 MG IN D5W 100 ML (0.08MG/ML) PREMIX OPTIME
INJECTION | INTRAVENOUS | Status: AC
Start: 2016-01-01 — End: 2016-01-01
  Filled 2016-01-01: qty 100

## 2016-01-01 MED ORDER — MORPHINE SULFATE (PF) 0.5 MG/ML IJ SOLN
INTRAMUSCULAR | Status: AC
  Filled 2016-01-01: qty 10

## 2016-01-01 MED ORDER — TETANUS-DIPHTH-ACELL PERTUSSIS 5-2.5-18.5 LF-MCG/0.5 IM SUSP: 0.5000 mL | Freq: Once | INTRAMUSCULAR | Status: DC

## 2016-01-01 MED ORDER — ZOLPIDEM TARTRATE 5 MG PO TABS
5.0000 mg | ORAL_TABLET | Freq: Every evening | ORAL | Status: DC | PRN
Start: 2016-01-01 — End: 2016-01-03

## 2016-01-01 MED ORDER — WITCH HAZEL-GLYCERIN EX PADS: 1.0000 "application " | MEDICATED_PAD | CUTANEOUS | Status: DC | PRN

## 2016-01-01 MED ORDER — BUPIVACAINE HCL (PF) 0.25 % IJ SOLN
INTRAMUSCULAR | Status: AC
  Filled 2016-01-01: qty 30

## 2016-01-01 MED ORDER — PHENYLEPHRINE 8 MG IN D5W 100 ML (0.08MG/ML) PREMIX OPTIME
INJECTION | INTRAVENOUS | Status: DC | PRN
  Administered 2016-01-01: 60 ug/min via INTRAVENOUS

## 2016-01-01 MED ORDER — KETOROLAC TROMETHAMINE 30 MG/ML IJ SOLN
INTRAMUSCULAR | Status: AC
Start: 2016-01-01 — End: 2016-01-01
  Filled 2016-01-01: qty 1

## 2016-01-01 MED ORDER — NALOXONE HCL 0.4 MG/ML IJ SOLN: 0.4000 mg | INTRAMUSCULAR | Status: DC | PRN

## 2016-01-01 MED ORDER — LACTATED RINGERS IV SOLN
Freq: Once | INTRAVENOUS | Status: AC
  Administered 2016-01-01: 08:00:00 via INTRAVENOUS

## 2016-01-01 MED ORDER — ONDANSETRON HCL 4 MG/2ML IJ SOLN
INTRAMUSCULAR | Status: DC | PRN
  Administered 2016-01-01: 4 mg via INTRAVENOUS

## 2016-01-01 MED ORDER — OXYCODONE-ACETAMINOPHEN 5-325 MG PO TABS: 2.0000 | ORAL_TABLET | ORAL | Status: DC | PRN

## 2016-01-01 MED ORDER — FENTANYL CITRATE (PF) 100 MCG/2ML IJ SOLN
INTRAMUSCULAR | Status: DC | PRN
  Administered 2016-01-01: 15 ug via INTRATHECAL

## 2016-01-01 MED ORDER — SIMETHICONE 80 MG PO CHEW
80.0000 mg | CHEWABLE_TABLET | ORAL | Status: DC
  Administered 2016-01-01 – 2016-01-02 (×2): 80 mg via ORAL
  Filled 2016-01-01 (×2): qty 1

## 2016-01-01 MED ORDER — KETOROLAC TROMETHAMINE 30 MG/ML IJ SOLN
30.0000 mg | Freq: Once | INTRAMUSCULAR | Status: AC
  Administered 2016-01-01: 30 mg via INTRAMUSCULAR

## 2016-01-01 MED ORDER — OXYTOCIN 10 UNIT/ML IJ SOLN
INTRAMUSCULAR | Status: AC
  Filled 2016-01-01: qty 4

## 2016-01-01 MED ORDER — BUPIVACAINE HCL (PF) 0.25 % IJ SOLN
INTRAMUSCULAR | Status: DC | PRN
  Administered 2016-01-01: 30 mL

## 2016-01-01 MED ORDER — SCOPOLAMINE 1 MG/3DAYS TD PT72
1.0000 | MEDICATED_PATCH | Freq: Once | TRANSDERMAL | Status: DC
  Administered 2016-01-01: 1.5 mg via TRANSDERMAL

## 2016-01-01 MED ORDER — MENTHOL 3 MG MT LOZG
1.0000 | LOZENGE | OROMUCOSAL | Status: DC | PRN
  Administered 2016-01-02: 3 mg via ORAL
  Filled 2016-01-01: qty 9

## 2016-01-01 MED ORDER — PRENATAL MULTIVITAMIN CH
1.0000 | ORAL_TABLET | Freq: Every day | ORAL | Status: DC
  Administered 2016-01-02 – 2016-01-03 (×2): 1 via ORAL
  Filled 2016-01-01 (×2): qty 1

## 2016-01-01 MED ORDER — LACTATED RINGERS IV SOLN
INTRAVENOUS | Status: DC
  Administered 2016-01-01 (×2): via INTRAVENOUS

## 2016-01-01 MED ORDER — PHENYLEPHRINE 8 MG IN D5W 100 ML (0.08MG/ML) PREMIX OPTIME
INJECTION | INTRAVENOUS | Status: AC
  Filled 2016-01-01: qty 100

## 2016-01-01 MED ORDER — NALOXONE HCL 2 MG/2ML IJ SOSY: 1.0000 ug/kg/h | PREFILLED_SYRINGE | INTRAVENOUS | Status: DC | PRN

## 2016-01-01 MED ORDER — LANOLIN HYDROUS EX OINT: 1.0000 "application " | TOPICAL_OINTMENT | CUTANEOUS | Status: DC | PRN

## 2016-01-01 MED ORDER — IBUPROFEN 600 MG PO TABS
600.0000 mg | ORAL_TABLET | Freq: Four times a day (QID) | ORAL | Status: DC
  Administered 2016-01-01 – 2016-01-03 (×7): 600 mg via ORAL
  Filled 2016-01-01 (×7): qty 1

## 2016-01-01 MED ORDER — SCOPOLAMINE 1 MG/3DAYS TD PT72
MEDICATED_PATCH | TRANSDERMAL | Status: DC
Start: 2016-01-01 — End: 2016-01-03
  Administered 2016-01-01: 1.5 mg via TRANSDERMAL
  Filled 2016-01-01: qty 1

## 2016-01-01 MED ORDER — LACTATED RINGERS IV SOLN
INTRAVENOUS | Status: DC
  Administered 2016-01-01: 18:00:00 via INTRAVENOUS

## 2016-01-01 MED ORDER — DIPHENHYDRAMINE HCL 25 MG PO CAPS: 25.0000 mg | ORAL_CAPSULE | ORAL | Status: DC | PRN

## 2016-01-01 MED ORDER — OXYTOCIN 10 UNIT/ML IJ SOLN
40.0000 [IU] | INTRAMUSCULAR | Status: DC | PRN
  Administered 2016-01-01: 40 [IU] via INTRAVENOUS

## 2016-01-01 MED ORDER — SIMETHICONE 80 MG PO CHEW: 80.0000 mg | CHEWABLE_TABLET | ORAL | Status: DC | PRN

## 2016-01-01 MED ORDER — ACETAMINOPHEN 325 MG PO TABS: 650.0000 mg | ORAL_TABLET | ORAL | Status: DC | PRN

## 2016-01-01 MED ORDER — SODIUM CHLORIDE 0.9 % IR SOLN
Status: DC | PRN
  Administered 2016-01-01: 1000 mL

## 2016-01-01 MED ORDER — OXYTOCIN 10 UNIT/ML IJ SOLN: 2.5000 [IU]/h | INTRAVENOUS | Status: AC

## 2016-01-01 MED ORDER — SENNOSIDES-DOCUSATE SODIUM 8.6-50 MG PO TABS
2.0000 | ORAL_TABLET | ORAL | Status: DC
  Administered 2016-01-01 – 2016-01-02 (×2): 2 via ORAL
  Filled 2016-01-01 (×2): qty 2

## 2016-01-01 SURGICAL SUPPLY — 30 items
BENZOIN TINCTURE PRP APPL 2/3 (GAUZE/BANDAGES/DRESSINGS) ×3 IMPLANT
CLAMP CORD UMBIL (MISCELLANEOUS) IMPLANT
CLOSURE STERI STRIP 1/2 X4 (GAUZE/BANDAGES/DRESSINGS) ×3 IMPLANT
CLOTH BEACON ORANGE TIMEOUT ST (SAFETY) ×3 IMPLANT
DRAPE SHEET LG 3/4 BI-LAMINATE (DRAPES) IMPLANT
DRSG OPSITE POSTOP 4X10 (GAUZE/BANDAGES/DRESSINGS) ×3 IMPLANT
DURAPREP 26ML APPLICATOR (WOUND CARE) ×3 IMPLANT
ELECT REM PT RETURN 9FT ADLT (ELECTROSURGICAL) ×3
ELECTRODE REM PT RTRN 9FT ADLT (ELECTROSURGICAL) ×1 IMPLANT
EXTRACTOR VACUUM M CUP 4 TUBE (SUCTIONS) IMPLANT
EXTRACTOR VACUUM M CUP 4' TUBE (SUCTIONS)
GLOVE BIOGEL PI IND STRL 7.0 (GLOVE) ×2 IMPLANT
GLOVE BIOGEL PI INDICATOR 7.0 (GLOVE) ×4
GLOVE ECLIPSE 7.0 STRL STRAW (GLOVE) ×6 IMPLANT
GOWN STRL REUS W/TWL LRG LVL3 (GOWN DISPOSABLE) ×6 IMPLANT
KIT ABG SYR 3ML LUER SLIP (SYRINGE) IMPLANT
NEEDLE HYPO 22GX1.5 SAFETY (NEEDLE) ×3 IMPLANT
NEEDLE HYPO 25X5/8 SAFETYGLIDE (NEEDLE) IMPLANT
NS IRRIG 1000ML POUR BTL (IV SOLUTION) ×3 IMPLANT
PACK C SECTION WH (CUSTOM PROCEDURE TRAY) ×3 IMPLANT
PAD OB MATERNITY 4.3X12.25 (PERSONAL CARE ITEMS) ×3 IMPLANT
PENCIL SMOKE EVAC W/HOLSTER (ELECTROSURGICAL) ×3 IMPLANT
RTRCTR C-SECT PINK 25CM LRG (MISCELLANEOUS) ×3 IMPLANT
SUT PLAIN 2 0 XLH (SUTURE) ×3 IMPLANT
SUT VIC AB 0 CTX 36 (SUTURE) ×6
SUT VIC AB 0 CTX36XBRD ANBCTRL (SUTURE) ×3 IMPLANT
SUT VIC AB 4-0 KS 27 (SUTURE) ×3 IMPLANT
SYR 30ML LL (SYRINGE) ×3 IMPLANT
TOWEL OR 17X24 6PK STRL BLUE (TOWEL DISPOSABLE) ×3 IMPLANT
TRAY FOLEY CATH SILVER 14FR (SET/KITS/TRAYS/PACK) ×3 IMPLANT

## 2016-01-01 NOTE — Transfer of Care (Signed)
Immediate Anesthesia Transfer of Care Note  Patient: Tara Pope  Procedure(s) Performed: Procedure(s): CESAREAN SECTION (N/A)  Patient Location: PACU  Anesthesia Type:Spinal  Level of Consciousness: awake, alert  and oriented  Airway & Oxygen Therapy: Patient Spontanous Breathing  Post-op Assessment: Report given to RN and Post -op Vital signs reviewed and stable  Post vital signs: Reviewed and stable  Last Vitals:  Filed Vitals:   01/01/16 0754  BP: 110/74  Pulse: 79  Temp: 36.9 C  Resp: 20    Complications: No apparent anesthesia complications

## 2016-01-01 NOTE — Anesthesia Procedure Notes (Signed)
Spinal Patient location during procedure: OB Start time: 01/01/2016 8:53 AM End time: 01/01/2016 8:58 AM Staffing Anesthesiologist: Ronelle Nigh Performed by: anesthesiologist  Preanesthetic Checklist Completed: patient identified, surgical consent, pre-op evaluation, timeout performed, IV checked, risks and benefits discussed and monitors and equipment checked Spinal Block Patient position: sitting Prep: site prepped and draped and DuraPrep Patient monitoring: heart rate, cardiac monitor, continuous pulse ox and blood pressure Approach: midline Location: L3-4 Injection technique: single-shot Needle Needle type: Pencan  Needle gauge: 24 G Needle length: 10 cm Assessment Sensory level: T4 Additional Notes No complications

## 2016-01-01 NOTE — Anesthesia Postprocedure Evaluation (Signed)
Anesthesia Post Note  Patient: Tara Pope  Procedure(s) Performed: Procedure(s) (LRB): CESAREAN SECTION (N/A)  Patient location during evaluation: Mother Baby Anesthesia Type: Spinal Level of consciousness: awake and alert and patient cooperative Pain management: pain level controlled Vital Signs Assessment: post-procedure vital signs reviewed and stable Respiratory status: spontaneous breathing and nonlabored ventilation Cardiovascular status: stable Postop Assessment: no headache, no backache, patient able to bend at knees, no signs of nausea or vomiting and adequate PO intake Anesthetic complications: no Comments: Pain goal met Pain 5, does not want medication    Last Vitals:  Filed Vitals:   01/01/16 1343 01/01/16 1442  BP: 95/54 101/62  Pulse: 73 74  Temp: 36.5 C 36.8 C  Resp: 18 20    Last Pain:  Filed Vitals:   01/01/16 1510  PainSc: 4                  Mase Dhondt

## 2016-01-01 NOTE — Addendum Note (Signed)
Addendum  created 01/01/16 1647 by Collier Flowers, CRNA   Modules edited: Clinical Notes   Clinical Notes:  File: [id number]

## 2016-01-01 NOTE — Addendum Note (Signed)
Addendum  created 01/01/16 1447 by Ronelle Nigh, MD   Modules edited: Orders, PRL Based Order Sets

## 2016-01-01 NOTE — Brief Op Note (Signed)
01/01/2016  10:00 AM  PATIENT:  Tara Pope  40 y.o. female  PRE-OPERATIVE DIAGNOSIS:  PREVOUS C SECTION AND PLACENTA PREVIA   37 WEEKS   POST-OPERATIVE DIAGNOSIS:  PREVOUS C SECTION AND PLACENTA PREVIA   37   PROCEDURE:  Procedure(s): CESAREAN SECTION (N/A)  SURGEON:  Surgeon(s) and Role:    * Reva Bores, MD - Primary    * Federico Flake, MD - Fellow  PHYSICIAN ASSISTANT:   ASSISTANTS: Aris Everts MS   ANESTHESIA:   spinal  EBL:  Total I/O In: 1000 [I.V.:1000] Out: 1300 [Urine:400; Blood:900]  BLOOD ADMINISTERED:none  DRAINS: none   LOCAL MEDICATIONS USED:  MARCAINE     SPECIMEN:  Source of Specimen:  Placenta  DISPOSITION OF SPECIMEN:  LD  COUNTS:  YES  TOURNIQUET:  * No tourniquets in log *  DICTATION: .Note written in EPIC  PLAN OF CARE: Admit to inpatient   PATIENT DISPOSITION:  PACU - hemodynamically stable.   Delay start of Pharmacological VTE agent (>24hrs) due to surgical blood loss or risk of bleeding: yes

## 2016-01-01 NOTE — Anesthesia Postprocedure Evaluation (Signed)
Anesthesia Post Note  Patient: Tara Pope  Procedure(s) Performed: Procedure(s) (LRB): CESAREAN SECTION (N/A)  Patient location during evaluation: PACU Anesthesia Type: General Level of consciousness: awake and alert Pain management: pain level controlled Vital Signs Assessment: post-procedure vital signs reviewed and stable Respiratory status: spontaneous breathing, nonlabored ventilation, respiratory function stable and patient connected to nasal cannula oxygen Cardiovascular status: blood pressure returned to baseline and stable Postop Assessment: no signs of nausea or vomiting Anesthetic complications: no    Last Vitals:  Filed Vitals:   01/01/16 1115 01/01/16 1123  BP:  90/60  Pulse: 74 73  Temp:  36.7 C  Resp:  20    Last Pain: There were no vitals filed for this visit.               Drayke Grabel L

## 2016-01-01 NOTE — Interval H&P Note (Signed)
History and Physical Interval Note:  01/01/2016 8:44 AM  Tara Pope  has presented today for surgery, with the diagnosis of PREVOUS C SECTION AND PLACENTA PREVIA   37 WEEKS   The various methods of treatment have been discussed with the patient and family. After consideration of risks, benefits and other options for treatment, the patient has consented to  Procedure(s): CESAREAN SECTION (N/A) as a surgical intervention .  The patient's history has been reviewed, patient examined, no change in status, stable for surgery.  I have reviewed the patient's chart and labs.  Questions were answered to the patient's satisfaction.     Travor Royce S

## 2016-01-01 NOTE — Anesthesia Preprocedure Evaluation (Addendum)
Anesthesia Evaluation  Patient identified by MRN, date of birth, ID band Patient awake    Reviewed: Allergy & Precautions, H&P , NPO status , Patient's Chart, lab work & pertinent test results  Airway Mallampati: II  TM Distance: >3 FB Neck ROM: full    Dental  (+) Dental Advisory Given, Teeth Intact Both upper front lateral incisors are capped:   Pulmonary neg pulmonary ROS,    Pulmonary exam normal breath sounds clear to auscultation       Cardiovascular Exercise Tolerance: Good negative cardio ROS Normal cardiovascular exam Rhythm:regular Rate:Normal     Neuro/Psych negative neurological ROS  negative psych ROS   GI/Hepatic negative GI ROS, Neg liver ROS,   Endo/Other  negative endocrine ROS  Renal/GU negative Renal ROS  negative genitourinary   Musculoskeletal   Abdominal (+) + obese,   Peds  Hematology negative hematology ROS (+)   Anesthesia Other Findings   Reproductive/Obstetrics negative OB ROS                            Anesthesia Physical Anesthesia Plan  ASA: II  Anesthesia Plan: Spinal   Post-op Pain Management:    Induction:   Airway Management Planned:   Additional Equipment:   Intra-op Plan:   Post-operative Plan:   Informed Consent: I have reviewed the patients History and Physical, chart, labs and discussed the procedure including the risks, benefits and alternatives for the proposed anesthesia with the patient or authorized representative who has indicated his/her understanding and acceptance.   Dental Advisory Given  Plan Discussed with: CRNA and Surgeon  Anesthesia Plan Comments:         Anesthesia Quick Evaluation

## 2016-01-01 NOTE — Op Note (Signed)
Tara Pope 01/01/2016  10:00 AM  PATIENT:  Tara Pope  40 y.o. female  PRE-OPERATIVE DIAGNOSIS:  PREVOUS C SECTION AND PLACENTA PREVIA   37 WEEKS   POST-OPERATIVE DIAGNOSIS:  PREVOUS C SECTION AND PLACENTA PREVIA   37   PROCEDURE:  Procedure(s): CESAREAN SECTION (N/A)  SURGEON:  Surgeon(s) and Role:    * Reva Bores, MD - Primary    * Federico Flake, MD - Fellow  PHYSICIAN ASSISTANT:   ASSISTANTS: Aris Everts MS3  ANESTHESIA:   spinal  EBL:  Total I/O In: 1000 [I.V.:1000] Out: 1300 [Urine:400; Blood:900]  INDICATIONS: Tara Pope is a 40 y.o. G3P3001 at [redacted]w[redacted]d here for cesarean section secondary to the indications listed under preoperative diagnoses; please see preoperative note for further details.  The risks of cesarean section were discussed with the patient including but were not limited to: bleeding which may require transfusion or reoperation; infection which may require antibiotics; injury to bowel, bladder, ureters or other surrounding organs; injury to the fetus; need for additional procedures including hysterectomy in the event of a life-threatening hemorrhage; placental abnormalities wth subsequent pregnancies, incisional problems, thromboembolic phenomenon and other postoperative/anesthesia complications.   The patient concurred with the proposed plan, giving informed written consent for the procedure.    FINDINGS:  Viable female infant in cephalic presentation.  Apgars 9 and 9.  Clear amniotic fluid.  Intact placenta, three vessel cord.  Normal uterus, fallopian tubes and ovaries bilaterally. Dense adhesion of rectus to peritoneum  PROCEDURE IN DETAIL:  The patient preoperatively received intravenous antibiotics and had sequential compression devices applied to her lower extremities.  She was then taken to the operating room where spinal anesthesia was administeredand was found to be adequate. She was then placed in a dorsal supine position with a  leftward tilt, and prepped and draped in a sterile manner.  A foley catheter was placed into her bladder and attached to constant gravity.  After an adequate timeout was performed, a Pfannenstiel skin incision was made with scalpel and carried through to the underlying layer of fascia. The fascia was incised in the midline, and this incision was extended bilaterally using the Mayo scissors.  Kocher clamps were applied to the superior aspect of the fascial incision and the underlying rectus muscles were dissected off sharply. The rectus muscles were separated in the midline bluntly and the peritoneum was entered bluntly. Attention was turned to the lower uterine segment where a low transverse hysterotomy was made with a scalpel and extended bilaterally bluntly.  The infant was successfully delivered, the cord was clamped and cut and the infant was handed over to awaiting neonatology team. Uterine massage was then administered, and the placenta delivered intact with a three-vessel cord. The uterus was then cleared of clot and debris.  The hysterotomy was closed with 0 Vicryl in a running locked fashion, no imbricating layer was placed.  The pelvis was cleared of all clot and debris. Hemostasis was confirmed on all surfaces.  The peritoneum and the muscles were reapproximated using 0 Vicryl using a purse string stitch. The fascia was then closed using 0 Vicryl in a running fashion.  The subcutaneous layer was irrigated, then reapproximated with 2-0 plain gut interrupted stitches, and 30 ml of 0.5% Marcaine was injected subcutaneously around the incision.  The skin was closed with a 4-0 Vicryl subcuticular stitch. The patient tolerated the procedure well. Sponge, lap, instrument and needle counts were correct x 2.  She was taken  to the recovery room in stable condition.   Federico Flake, MD , MPH, ABFM Family Medicine, OB Fellow [facility]

## 2016-01-01 NOTE — Addendum Note (Signed)
Addendum  created 01/01/16 1221 by Ronelle Nigh, MD   Modules edited: Orders, PRL Based Order Sets

## 2016-01-02 LAB — CBC
HCT: 30.7 % — ABNORMAL LOW (ref 36.0–46.0)
Hemoglobin: 10.2 g/dL — ABNORMAL LOW (ref 12.0–15.0)
MCH: 29.3 pg (ref 26.0–34.0)
MCHC: 33.2 g/dL (ref 30.0–36.0)
MCV: 88.2 fL (ref 78.0–100.0)
PLATELETS: 261 10*3/uL (ref 150–400)
RBC: 3.48 MIL/uL — ABNORMAL LOW (ref 3.87–5.11)
RDW: 15.2 % (ref 11.5–15.5)
WBC: 9 10*3/uL (ref 4.0–10.5)

## 2016-01-02 NOTE — Lactation Note (Signed)
This note was copied from the chart of Tara Pope. Lactation Consultation Note  Patient Name: Tara Pope Date: 01/02/2016 Reason for consult: Initial assessment;Late preterm infant   Initial consult with exp BF mom of 31 hour old infant born at 57 weeks. Spoke with mom through [employer] # [phone]. Infant weighs 7 lb 6.2 oz with a 2% weight loss since birth. Infant with 5 BF for 10-15 minutes, 8 formula feeds of 3-30 cc, 6 voids and 4 stools in last 24 hours. No LATCH scores are documented, RN is aware. Infant had just BF and mom was giving him a bottle when I went into the room. Asked her to call out to desk with next feeding for feeding assessment. Gave mom Spanish [employer], informed mom of OP Services, LC phone # and BF Support Groups. Gave mom Feeding log and Breastfeeding Resources sheet. Reviewed Spanish Caring for your Late Preterm Infant handout with mom. Mom voiced she does not have any milk yet. She gave formula to her first 2 infants while BF before milk was in. She does not want to pump as she does not believe she has any milk. Enc her to BF 8-12 x in 24 hours at first feeding cues and to offer formula post BF. Mom voiced understanding. Reviewed formula amounts per LPT infant handout and advised mom to increase with each day of age. Mom voiced she has no questions at this time. Mom is a [facility] client and is aware to call for appointment at d/c. Mom denies nipple pain or tenderness. Mom does not have a breast pump, manual pump given, she reports she has used one before. Enc mom to call with questions/concerns. Follow up tomorrow.    Maternal Data Formula Feeding for Exclusion: No Reason for exclusion: Mother's choice to formula and breast feed on admission Does the patient have breastfeeding experience prior to this delivery?: Yes  Feeding    LATCH Score/Interventions                      Lactation Tools Discussed/Used WIC Program:  Yes   Consult Status Consult Status: Follow-up Date: 01/03/16 Follow-up type: In-patient    Tara Pope 01/02/2016, 5:12 PM

## 2016-01-02 NOTE — Progress Notes (Signed)
Subjective: Postpartum Day #1: Cesarean Delivery Patient reports tolerating PO and no problems voiding.  Breast and bottlefeeding; contraception not discussed.  Objective: Vital signs in last 24 hours: Temp:  [97.7 F (36.5 C)-98.7 F (37.1 C)] 98.5 F (36.9 C) (02/04 0641) Pulse Rate:  [60-82] 73 (02/04 0641) Resp:  [16-20] 18 (02/04 0641) BP: (89-105)/(54-87) 97/63 mmHg (02/04 0641) SpO2:  [95 %-100 %] 99 % (02/04 0641)  Physical Exam:  General: alert and cooperative Lochia: appropriate Uterine Fundus: firm Incision: honeycomb dsg intact; sm drainage DVT Evaluation: No evidence of DVT seen on physical exam.   Recent Labs  12/31/15 1214 01/02/16 0603  HGB 12.4 10.2*  HCT 35.7* 30.7*    Assessment/Plan: Status post Cesarean section. Doing well postoperatively.  Continue current care. Anticipate d/c 01/03/16.  Cam Hai CNM 01/02/2016, 9:13 AM
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
## 2016-01-03 ENCOUNTER — Encounter (HOSPITAL_COMMUNITY): Payer: Self-pay | Admitting: Family Medicine

## 2016-01-03 MED ORDER — IBUPROFEN 600 MG PO TABS: 600.0000 mg | ORAL_TABLET | Freq: Four times a day (QID) | ORAL | Status: DC

## 2016-01-03 MED ORDER — SENNOSIDES-DOCUSATE SODIUM 8.6-50 MG PO TABS: 2.0000 | ORAL_TABLET | ORAL | Status: DC

## 2016-01-03 MED ORDER — OXYCODONE-ACETAMINOPHEN 5-325 MG PO TABS: 1.0000 | ORAL_TABLET | ORAL | Status: DC | PRN

## 2016-01-03 NOTE — Discharge Instructions (Signed)
Cuidados en el postparto luego de un parto por cesrea  (Postpartum Care After Cesarean Delivery) Despus del parto (perodo de postparto), la estada normal en el hospital es de 24-72 horas. Si hubo problemas con el trabajo de parto o el parto, o si tiene otros problemas mdicos, es posible que deba permanecer en el hospital por ms tiempo.  Mientras est en el hospital, recibir ayuda e instrucciones sobre cmo cuidar de usted misma y de su beb recin nacido durante el postparto.  Mientras est en el hospital:   Es normal que sienta dolor o molestias en la incisin en el abdomen. Asegrese de decirle a las enfermeras si siente dolor, as como donde siente el dolor y qu empeora el dolor.  Si est amamantando, puede sentir contracciones dolorosas en el tero durante algunas semanas. Esto es normal. Las contracciones ayudan a que el tero vuelva a su tamao normal.  Es normal tener algo de sangrado despus del parto.  Durante los primeros 1-3 das despus del parto, el flujo es de color rojo y la cantidad puede ser similar a un perodo.  Es frecuente que el flujo se inicie y se detenga.  En los primeros das, puede eliminar algunos cogulos pequeos. Informe a las enfermeras si comienza a eliminar cogulos grandes o aumenta el flujo.  No  elimine los cogulos de sangre por el inodoro antes de que la enfermera los vea.  Durante los prximos 3 a 10 das despus del parto, el flujo debe ser ms acuoso y rosado o marrn.  De diez a catorce das despus del parto, el flujo debe ser una pequea cantidad de secrecin de color blanco amarillento.  La cantidad de flujo disminuir en las primeras semanas despus del parto. El flujo puede detenerse en 6-8 semanas. La mayora de las mujeres no tienen ms flujo a las 12 semanas despus del parto.  Usted debe cambiar sus apsitos con frecuencia.  Lvese bien las manos con agua y jabn durante al menos 20 segundos despus de cambiar el apsito, usar el  bao o antes de sostener o alimentar a su recin nacido.  Se le quitar la va intravenosa (IV) cuando ya est bebiendo suficientes lquidos.  El tubo de drenaje para la orina (catter urinario) que se inserta antes del parto puede ser retirado luego de 6-8 horas despus del parto o cuando las piernas vuelvan a tener sensibilidad. Usted puede sentir que tiene que vaciar la vejiga durante las primeras 6-8 horas despus de que le quiten el catter.  Si se siente dbil, mareada o se desmaya, llame a su enfermera antes de levantarse de la cama por primera vez y antes de tomar una ducha por primera vez.  En los primeros das despus del parto, podr sentir las mamas sensibles y llenas. Esto se llama congestin. La sensibilidad en los senos por lo general desaparece dentro de las 48-72 horas despus de que ocurre la congestin. Tambin puede notar que la leche se escapa de sus senos. Si no est amamantando no estimule sus pechos. La estimulacin de las mamas hace que sus senos produzcan ms leche.  Pasar tanto tiempo como sea posible con el beb recin nacido es muy importante. Durante este tiempo, usted y su beb deben sentirse cerca y conocerse uno al otro. Tener al beb en su habitacin (alojamiento conjunto) ayudar a fortalecer el vnculo con el beb recin nacido. Esto le dar tiempo para conocerlo y atenderlo de manera cmoda.  Las hormonas se modifican despus del parto. A   veces, los cambios hormonales pueden causar tristeza o ganas de llorar por un tiempo. Estos sentimientos no deben durar ms de unos pocos das. Si duran ms que eso, debe hablar con su mdico.  Si lo desea, hable con su mdico acerca de los mtodos de planificacin familiar o mtodos anticonceptivos.  Hable con su mdico acerca de las vacunas. El mdico puede indicarle que se aplique las siguientes vacunas antes de salir del hospital:  Vacuna contra el ttanos, la difteria y la tos ferina (Tdap) o el ttanos y la difteria (Td).  Es muy importante que usted y su familia (incluyendo a los abuelos) u otras personas que cuidan al recin nacido estn al da con las vacunas Tdap o Td. Las vacunas Tdap o Td pueden ayudar a proteger al recin nacido de enfermedades.  Inmunizacin contra la rubola.  Inmunizacin contra la varicela.  Inmunizacin contra la gripe. Usted debe recibir esta vacunacin anual si no la ha recibido durante el embarazo.   Esta informacin no tiene como fin reemplazar el consejo del mdico. Asegrese de hacerle al mdico cualquier pregunta que tenga.   Document Released: 10/31/2012 Elsevier Interactive Patient Education 2016 Elsevier Inc.  

## 2016-01-03 NOTE — Lactation Note (Addendum)
This note was copied from the chart of Tara Pope. Lactation Consultation Note  Patient Name: Tara Pope [id number] Date: 01/03/2016 Reason for consult: Follow-up assessment;Other (Comment) (6% weight loss , 37 0/7 weeks )  [facility] Spanish interpreter present - [name]  Weight today 7-1.2 oz , 6 % weight loss.  Per mom breast are fuller , heavier and warmer.  Per mom - due to nipple baby isn't latching on the left breast.  LC assessed breast tissue with moms permission and not the left areola to be swollen , semi compressible , semi inverted nipple.  LC attempted latching the baby on the left breast , unable to obtain depth. LC instructed mom on the use shells ( which will elongate nipple areola complex) LC assisted with depth and latch on the right breast side. Multiply swallows noted, increased with breast compressions. Nipple well rounded when abby released.  For now wearing shells will help both nipples, and pumping on the left until the tissue is more workable for latching.  Mom denies sore ness. Sore nipple and engorgement prevention and tx reviewed.  LC instructed mom on the use of hand pump. #24 flange fine for today, #27 Flange given for when milk comes in.  LC explained this all to mom.     Maternal Data Has patient been taught Hand Expression?: Yes  Feeding Feeding Type: Breast Fed Length of feed: 8 min (multiply swallows )  LATCH Score/Interventions Latch: Grasps breast easily, tongue down, lips flanged, rhythmical sucking.  Audible Swallowing: Spontaneous and intermittent  Type of Nipple: Everted at rest and after stimulation  Comfort (Breast/Nipple): Soft / non-tender     Hold (Positioning): Assistance needed to correctly position infant at breast and maintain latch. (worked on depth) Intervention(s): Breastfeeding basics reviewed;Support Pillows;Position options;Skin to skin  LATCH Score: 9  Lactation Tools Discussed/Used Tools:  Shells;Pump;Flanges Flange Size: 27 Shell Type: Inverted Breast pump type: Manual Pump Review: Setup, frequency, and cleaning Initiated by:: MAI  Date initiated:: 01/03/16   Consult Status Consult Status: Complete Date: 01/03/16 Follow-up type: In-patient    Kathrin Greathouse 01/03/2016, 10:54 AM

## 2016-01-03 NOTE — Discharge Summary (Signed)
OB Discharge Summary     Patient Name: Tara Pope DOB: 09/07/1976 MRN: [id number]  Date of admission: 01/01/2016 Delivering MD: Reva Bores   Date of discharge: 01/03/2016  Admitting diagnosis: PREVOUS C SECTION AND PEA PREVIA   37 WEEKS  Intrauterine pregnancy: [redacted]w[redacted]d     Secondary diagnosis:  Principal Problem:   Placenta previa Active Problems:   Previous cesarean delivery, antepartum condition or complication   AMA (advanced maternal age) multigravida 35+  Additional problems:  Prior c/s x2     Discharge diagnosis: Term Pregnancy Delivered                                                                                                Post partum procedures:none  Augmentation: n/a  Complications: None  Hospital course:  Sceduled C/S   40 y.o. yo G3P3001 at [redacted]w[redacted]d was admitted to the hospital 01/01/2016 for scheduled cesarean section with the following indication:Previa.  Membrane Rupture Time/Date: 9:19 AM ,01/01/2016   Patient delivered a Viable infant.01/01/2016  Details of operation can be found in separate operative note.  Pateint had an uncomplicated postpartum course.  She is ambulating, tolerating a regular diet, passing flatus, and urinating well. Patient is discharged home in stable condition on  01/03/2016          Physical exam  Filed Vitals:   01/02/16 0309 01/02/16 0641 01/02/16 1852 01/03/16 0602  BP: 89/58 97/63 101/61 106/65  Pulse: 60 73 99 91  Temp: 98.3 F (36.8 C) 98.5 F (36.9 C) 98.2 F (36.8 C) 98.6 F (37 C)  TempSrc: Oral Oral Oral Oral  Resp: SpO2: 98% 99%     General: alert, cooperative and no distress Lochia: appropriate Uterine Fundus: firm Incision: Healing well with no significant drainage, No significant erythema DVT Evaluation: No evidence of DVT seen on physical exam. Labs: Lab Results  Component Value Date   WBC 9.0 01/02/2016   HGB 10.2* 01/02/2016   HCT 30.7* 01/02/2016   MCV 88.2 01/02/2016   PLT 261  01/02/2016   CMP Latest Ref Rng 10/13/2015  Glucose 65 - 99 mg/dL 409(W)  BUN 6 - 20 mg/dL 6  Creatinine 1.19 - 1.47 mg/dL 8.29  Sodium 562 - 130 mmol/L 136  Potassium 3.5 - 5.1 mmol/L 3.2(L)  Chloride 101 - 111 mmol/L 107  CO2 22 - 32 mmol/L 22  Calcium 8.9 - 10.3 mg/dL 8.9  Total Protein 6.5 - 8.1 g/dL 7.8  Total Bilirubin 0.3 - 1.2 mg/dL 0.5  Alkaline Phos 38 - 126 U/L 56  AST 15 - 41 U/L 22  ALT 14 - 54 U/L 15    Discharge instruction: per After Visit Summary and "Baby and Me Booklet".  After visit meds:    Medication List    TAKE these medications        ibuprofen 600 MG tablet  Commonly known as:  ADVIL,MOTRIN  Take 1 tablet (600 mg total) by mouth every 6 (six) hours.     multivitamin-prenatal 27-0.8 MG Tabs tablet  Take 1 tablet by mouth daily  at 12 noon.     oxyCODONE-acetaminophen 5-325 MG tablet  Commonly known as:  PERCOCET/ROXICET  Take 1-2 tablets by mouth every 4 (four) hours as needed (pain scale 4-7).     senna-docusate 8.6-50 MG tablet  Commonly known as:  Senokot-S  Take 2 tablets by mouth daily.        Diet: routine diet  Activity: Advance as tolerated. Pelvic rest for 6 weeks.   Outpatient follow up:6 weeks Follow up Appt:No future appointments. Follow up Visit:No Follow-up on file. Follow-up Information    Follow up with [facility]. Schedule an appointment as soon as possible for a visit in 6 weeks.   Why:  post partum check   Contact information:   [address] [place] [address] [phone]       Postpartum contraception: Depo Provera  Newborn Data: Live born female  Birth Weight: 7 lb 8.8 oz (3425 g) APGAR: 8, 9  Baby Feeding: Bottle and Breast Disposition:home with mother   01/03/2016 Wynne Dust, MD   OB FELLOW DISCHARGE ATTESTATION  I have seen and examined this patient and agree with above documentation in the resident's note.   Silvano Bilis, MD 9:25 AM

## 2016-01-04 LAB — TYPE AND SCREEN
ABO/RH(D): O POS
Antibody Screen: NEGATIVE
UNIT DIVISION: 0
UNIT DIVISION: 0

## 2016-01-05 NOTE — Progress Notes (Signed)
Post discharge chart review completed.  

## 2018-04-20 ENCOUNTER — Other Ambulatory Visit: Payer: Self-pay | Admitting: Obstetrics & Gynecology

## 2018-04-20 DIAGNOSIS — Z1231 Encounter for screening mammogram for malignant neoplasm of breast: Secondary | ICD-10-CM

## 2018-05-17 ENCOUNTER — Encounter: Payer: Self-pay | Admitting: Radiology

## 2018-05-17 ENCOUNTER — Ambulatory Visit
Admission: RE | Admit: 2018-05-17 | Discharge: 2018-05-17 | Disposition: A | Discharge status: Home / Self Care | Payer: No Typology Code available for payment source | Source: Ambulatory Visit | Attending: Obstetrics & Gynecology | Admitting: Obstetrics & Gynecology

## 2018-05-17 DIAGNOSIS — Z1231 Encounter for screening mammogram for malignant neoplasm of breast: Secondary | ICD-10-CM

## 2018-05-23 ENCOUNTER — Other Ambulatory Visit (HOSPITAL_COMMUNITY): Payer: Self-pay | Admitting: *Deleted

## 2018-05-23 DIAGNOSIS — R928 Other abnormal and inconclusive findings on diagnostic imaging of breast: Secondary | ICD-10-CM

## 2018-06-07 ENCOUNTER — Ambulatory Visit (HOSPITAL_COMMUNITY)
Admission: RE | Admit: 2018-06-07 | Discharge: 2018-06-07 | Disposition: A | Discharge status: Home / Self Care | Payer: Self-pay | Source: Ambulatory Visit | Attending: Obstetrics and Gynecology | Admitting: Obstetrics and Gynecology

## 2018-06-07 ENCOUNTER — Ambulatory Visit: Payer: No Typology Code available for payment source

## 2018-06-07 ENCOUNTER — Other Ambulatory Visit: Payer: No Typology Code available for payment source

## 2018-06-07 ENCOUNTER — Encounter (HOSPITAL_COMMUNITY): Payer: Self-pay

## 2018-06-07 ENCOUNTER — Ambulatory Visit
Admission: RE | Admit: 2018-06-07 | Discharge: 2018-06-07 | Disposition: A | Discharge status: Home / Self Care | Payer: No Typology Code available for payment source | Source: Ambulatory Visit | Attending: Obstetrics and Gynecology | Admitting: Obstetrics and Gynecology

## 2018-06-07 VITALS — BP 114/78 | Wt 202.4 lb

## 2018-06-07 DIAGNOSIS — R928 Other abnormal and inconclusive findings on diagnostic imaging of breast: Secondary | ICD-10-CM

## 2018-06-07 DIAGNOSIS — Z1239 Encounter for other screening for malignant neoplasm of breast: Secondary | ICD-10-CM

## 2018-06-07 NOTE — Addendum Note (Signed)
Encounter addended by: Priscille HeidelbergBrannock, Christine P, RN on: 06/07/2018 9:34 AM  Actions taken: Sign clinical note

## 2018-06-07 NOTE — Patient Instructions (Signed)
Explained breast self awareness Tara Pope. Patient did not need a Pap smear today due to last Pap smear was in 2018 per patient. Let her know [facility]CP will cover Pap smears every 3 years unless has a history of abnormal Pap smears. [facility] right breast diagnostic mamm[facility] and possible ultrasound per recommendation. Appointment scheduled for Thursday, June 07, 2018 at 0920. Tara Pope verbalized understanding.  Brannock, Kathaleen Maserhristine Poll, RN 9:23 AM

## 2018-06-07 NOTE — Progress Notes (Addendum)
[facility] due to recommending additional imaging of the right breast. Screening mammogram completed 05/17/2018.  Pap Smear: Pap smear not completed today. [facility] and normal per patient. Per patient has no history of an abnormal Pap smear. No Pap smear results are in Epic.  Physical exam: Breasts Breasts symmetrical. No skin abnormalities bilateral breasts. Left nipple slightly inverted that per patient is normal for her. Patient is currently breastfeeding. No lymphadenopathy. No lumps palpated bilateral breasts. No complaints of pain or tenderness on exam. [facility] for a right breast diagnostic mammogram and possible ultrasound per recommendation. Appointment scheduled for Thursday, June 07, 2018 at 0920.        Pelvic/Bimanual No Pap smear completed today since last Pap smear was in 2018 per patient. Pap smear not indicated per BCCCP guidelines.   Smoking History: Patient has never smoked.  Patient Navigation: Patient education provided. Access to services provided for patient through [facility]. Spanish interpreter provided.   Breast and Cervical Cancer Risk Assessment: Patient has no family history of breast cancer, known genetic mutations, or radiation treatment to the chest before age 41. Patient has no history of cervical dysplasia, immunocompromised, or DES exposure in-utero.  Risk Assessment    Risk Scores      06/07/2018   Last edited by: Lynnell DikeHolland, Sabrina H, LPN   5-year risk: 0.4 %   Lifetime risk: 8.6 %         Used Spanish interpreter Natale LayErika McReynolds from [facility].

## 2018-06-08 ENCOUNTER — Encounter (HOSPITAL_COMMUNITY): Payer: Self-pay | Admitting: *Deleted

## 2018-06-15 ENCOUNTER — Inpatient Hospital Stay: Payer: No Typology Code available for payment source

## 2018-06-15 ENCOUNTER — Inpatient Hospital Stay: Payer: No Typology Code available for payment source | Attending: Obstetrics and Gynecology | Admitting: *Deleted

## 2018-06-15 ENCOUNTER — Other Ambulatory Visit (HOSPITAL_COMMUNITY): Payer: Self-pay | Admitting: *Deleted

## 2018-06-15 VITALS — BP 130/70 | Ht 60.0 in | Wt 202.4 lb

## 2018-06-15 DIAGNOSIS — Z0000 Encounter for general adult medical examination without abnormal findings: Secondary | ICD-10-CM

## 2018-06-15 LAB — LIPID PANEL
Cholesterol: 251 mg/dL — ABNORMAL HIGH (ref 0–200)
HDL: 45 mg/dL (ref >40)
LDL CALC: 193 mg/dL — AB (ref 0–99)
Total CHOL/HDL Ratio: 5.6 RATIO
Triglycerides: 65 mg/dL (ref <150)
VLDL: 13 mg/dL (ref 0–40)

## 2018-06-15 LAB — HEMOGLOBIN A1C
HEMOGLOBIN A1C: 6.2 % — AB (ref 4.8–5.6)
Mean Plasma Glucose: 131.24 mg/dL

## 2018-06-15 NOTE — Progress Notes (Signed)
[facility] initial screening  Spanish Interpreter-  Tara RiggsMariel Pope  Clinical Measurement:  Height:  60in.  Weight: 202.4lb  Blood Pressure: 138/70 Blood Pressure #2: 130/70  Fasting Labs Drawn Today, will review with patient when they result.  Medical History:  Patient states that she has not been diagnosed with high cholesterol, high blood pressure, diabetes or heart disease.  Medications:  Patients states she is not taking any medications for high cholesterol, high blood pressure or diabetes.  She is not taking aspirin daily to prevent heart attack or stroke.    Blood pressure, self measurement:  Patients states she does not measure blood pressure at home.    Nutrition:  Patient states she eats 0.5 cup of fruit and 1 cup of vegetables in an average day.  Patient states she does not eat fish regularly, she eats less than half a serving of whole grains daily. She drinks less than 36 ounces of beverages with added sugar weekly.  She is currently watching her sodium intake.  She has not had any drinks containing alcohol in the last seven days.    Physical activity:  Patient states that she gets 0 minutes of moderate exercise in a week.  She gets 0 minutes of vigorous exercise per week.    Smoking status:  Patient states she has never smoked and is not around any smokers.    Quality of life:  Patient states that she has had 0 bad physical days out of the last 30 days. In the last 2 weeks, she has had 0 days that she has felt down or depressed. She has had 0 days in the last 2 weeks that she has had little interest or pleasure in doing things.  Risk reduction and counseling:  Patient states she wants to lose weight and increase fruit and vegetable intake.  I encouraged her start an  exercise regimen and increase vegetable and fruit intake.  Navigation:  I will notify patient of lab results.  Patient is aware of 2 more health coaching sessions and a follow up.

## 2018-07-04 ENCOUNTER — Telehealth (HOSPITAL_COMMUNITY): Payer: Self-pay | Admitting: *Deleted

## 2018-07-04 NOTE — Telephone Encounter (Signed)
Health coaching 2  Spanish Interpreter - Delorise RoyalsJulie Sowell  Labs-cholesterol 251, LDL cholesterol 193, triglycerides 65,HDL 45, hemoglobin A1C  6.2, mean plasma glucose 131.24   Patient aware and understands these lab results.  Goals- encouraged patient to lower her consumption of sugar and salt , also encouraged her to increase her vegetables,fruit and water consumption. Patient states she has begun walking 30 minutes per day and has started eating more fruits and vegetables and less sugar and salt.  Navigation:  Patient [facility] for July 09, 2018 at  8:45am. Patient is aware of 1 more health coaching sessions and a follow up.  Time -10 minutes

## 2018-07-09 ENCOUNTER — Other Ambulatory Visit: Payer: Self-pay

## 2018-07-09 ENCOUNTER — Encounter: Payer: Self-pay | Admitting: Internal Medicine

## 2018-07-09 ENCOUNTER — Ambulatory Visit (INDEPENDENT_AMBULATORY_CARE_PROVIDER_SITE_OTHER): Payer: Self-pay | Admitting: Internal Medicine

## 2018-07-09 VITALS — BP 104/68 | HR 80 | Temp 98.2°F | Ht 61.0 in | Wt 204.7 lb

## 2018-07-09 DIAGNOSIS — Z0000 Encounter for general adult medical examination without abnormal findings: Secondary | ICD-10-CM

## 2018-07-09 DIAGNOSIS — R7301 Impaired fasting glucose: Secondary | ICD-10-CM

## 2018-07-09 DIAGNOSIS — K219 Gastro-esophageal reflux disease without esophagitis: Secondary | ICD-10-CM

## 2018-07-09 DIAGNOSIS — Z79899 Other long term (current) drug therapy: Secondary | ICD-10-CM

## 2018-07-09 DIAGNOSIS — E785 Hyperlipidemia, unspecified: Secondary | ICD-10-CM

## 2018-07-09 MED ORDER — OMEPRAZOLE 20 MG PO CPDR: 20.0000 mg | DELAYED_RELEASE_CAPSULE | Freq: Every day | ORAL | 2 refills | Status: AC

## 2018-07-09 MED ORDER — ROSUVASTATIN CALCIUM 20 MG PO TABS: 20.0000 mg | ORAL_TABLET | Freq: Every day | ORAL | 3 refills | Status: AC

## 2018-07-09 MED FILL — ROSUVASTATIN CALCIUM 20 MG: 20 | 30 days supply | Qty: 30 | Fill #0

## 2018-07-09 MED FILL — OMEPRAZOLE 20 MG CAP: 20 | 30 days supply | Qty: 30 | Fill #0

## 2018-07-09 NOTE — Assessment & Plan Note (Signed)
GERD: Complaining of postprandial epigastric burning pain in the worsens with spicy food. -Start omeprazole 20 mg daily

## 2018-07-09 NOTE — Assessment & Plan Note (Signed)
Hyperlipidemia: Recent lipid panel on 06/15/2018 showed cholesterol of 251, LDL of 193, HDL 45.  ASCVD score of 1.8%.  However giving an isolated elevated LDL > 190, I will initiate high intensity statin today and have her follow-up  in 3 months.  Patient is uninsured but Crestor is on the $4 [facility]: Outpatient formulary. -Start Crestor 20 mg daily -Follow-up follow-up in 3 months -Order CMP (to evaluate LFTs), lipid panel in 3 months

## 2018-07-09 NOTE — Addendum Note (Signed)
Addended by: Yvette RackAGYEI, Grete Bosko K on: 07/09/2018 01:16 PM   Modules accepted: Orders

## 2018-07-09 NOTE — Assessment & Plan Note (Addendum)
  Impaired fasting glucose: Recent A1c of 6.2% on 06/15/2018.  Currently complaining of polyuria, polydipsia, numbness and tingling of the upper extremity.  I have advised patient on weight loss and exercise which she agreed.  -Refer to nutrition placed Lupita Leash(Donna) -Next visit plan: Repeat A1c, discuss initiation of metformin 500 mg daily if A1c not improved

## 2018-07-09 NOTE — Progress Notes (Addendum)
   CC: Establish care  HPI:  [name] is a 42 y.o. Spanish-speaking woman with no significant significant past medical history presenting to establish care.  [facility] by [facility] and had labs drawn which showed a lipid panel and A1c of 6.2.  She reports that she is doing well otherwise with exception of recent polyuria, polydipsia, numbness and tingling of her hands especially worse in the morning.  She also complains of burning epigastric pain that worsens with spicy foods.  Hyperlipidemia: Recent lipid panel on 06/15/2018 showed cholesterol of 251, LDL of 193, HDL 45.  ASCVD score of 1.8%.  However giving an isolated elevated LDL > 190, I will initiate high intensity statin today and have her follow-up  in 3 months.  Patient is uninsured but Crestor is on the $4 [facility]: Outpatient formulary. -Start Crestor 20 mg daily -Follow-up follow-up in 3 months -Order CMP (to evaluate LFTs), lipid panel in 3 months  Impaired fasting glucose: Recent A1c of 6.2% on 06/15/2018.  Currently complaining of polyuria, polydipsia, numbness and tingling of the upper extremity.  I have advised patient on weight loss and exercise which she agreed. -Refer to nutrition placed Tara Leash(Donna) -Next visit plan: Repeat A1c, discuss initiation of metformin 500 mg daily if A1c not improved  GERD: Complaining of postprandial epigastric burning pain in the worsens with spicy food. -Start omeprazole 20 mg daily  No past medical history on file.  Review of Systems:   Review of Systems  Constitutional: Negative for chills, fever and weight loss.  Eyes: Negative for double vision.  Respiratory: Negative for cough and shortness of breath.   Cardiovascular: Negative for chest pain, palpitations, orthopnea and leg swelling.  Gastrointestinal: Positive for heartburn and nausea. Negative for abdominal pain, constipation, diarrhea and vomiting.  Musculoskeletal: Negative for myalgias.  Skin: Negative for  rash.  Neurological: Negative for weakness and headaches.  Psychiatric/Behavioral: Negative for depression and substance abuse.    Physical Exam:  Vitals:   07/09/18 0841  BP: 104/68  Pulse: 80  Temp: 98.2 F (36.8 C)  TempSrc: Oral  SpO2: 98%  Weight: 204 lb 11.2 oz (92.9 kg)  Height: 5\' 1"  (1.549 m)   Physical Exam  Constitutional: She is well-developed, well-nourished, and in no distress. Vital signs are normal.  Non-toxic appearance. She does not have a sickly appearance.  HENT:  Head: Normocephalic and atraumatic.  Mouth/Throat: Oropharynx is clear and moist.  Cardiovascular: Normal rate, regular rhythm and normal heart sounds. Exam reveals no gallop and no friction rub.  No murmur heard. Pulmonary/Chest: Effort normal and breath sounds normal. No respiratory distress. She has no wheezes.  Abdominal: Soft. Bowel sounds are normal. She exhibits no distension. There is no tenderness.  Neurological: She is alert.  Skin: Skin is warm. No rash noted. No erythema.  Psychiatric: Mood normal.    Assessment & Plan:   See Encounters Tab for problem based charting.  Patient discussed with Dr. Heide SparkNarendra

## 2018-07-09 NOTE — Patient Instructions (Signed)
Mrs. Baldwin JamaicaVasquez,   It was a pleasure taking care of you at the clinic. Here is what we discussed today.   1)Your cholesterol is high so I am giving you a medication called Crestor 20mg . You will take this once daily 2)For the high sugar, I am going to repeat it in 3 months. But I will advice you on diet changes and weight loss as we discussed.  I have also made an appointment to see our nutritionist.  3)I will see you back in 3 months  NOTE: These instructions were verbally explained to patient in Spanish.   ~Dr. Dortha SchwalbeAgyei

## 2018-07-11 NOTE — Progress Notes (Signed)
Internal Medicine Clinic Attending  I saw and evaluated the patient.  I personally confirmed the key portions of the history and exam documented by Dr. Dortha SchwalbeAgyei and I reviewed pertinent patient test results.  The assessment, diagnosis, and plan were formulated together and I agree with the documentation in the resident's note.  Of note, Dr. Dortha SchwalbeAgyei and I discussed the initiation of metformin 500 mg at next visit if her A1C is not improved

## 2018-07-11 NOTE — Progress Notes (Signed)
Hi Dr. Heide SparkNarendra,   I have made the changes.   Thank You

## 2018-08-01 ENCOUNTER — Ambulatory Visit: Payer: No Typology Code available for payment source

## 2018-08-01 ENCOUNTER — Encounter: Payer: No Typology Code available for payment source | Admitting: Dietician

## 2018-08-06 NOTE — Addendum Note (Signed)
Addended by: Neomia Dear on: 08/06/2018 04:28 PM   Modules accepted: Orders

## 2018-08-24 ENCOUNTER — Ambulatory Visit: Payer: No Typology Code available for payment source

## 2018-12-11 ENCOUNTER — Encounter: Payer: Self-pay | Admitting: Internal Medicine

## 2019-08-01 ENCOUNTER — Encounter: Payer: Self-pay | Admitting: Emergency Medicine

## 2019-08-01 ENCOUNTER — Emergency Department
Admission: EM | Admit: 2019-08-01 | Discharge: 2019-08-01 | Disposition: A | Discharge status: Home / Self Care | Payer: Self-pay | Attending: Student | Admitting: Student

## 2019-08-01 ENCOUNTER — Emergency Department: Payer: Self-pay

## 2019-08-01 ENCOUNTER — Other Ambulatory Visit: Payer: Self-pay

## 2019-08-01 DIAGNOSIS — M25511 Pain in right shoulder: Secondary | ICD-10-CM | POA: Insufficient documentation

## 2019-08-01 DIAGNOSIS — M25572 Pain in left ankle and joints of left foot: Secondary | ICD-10-CM | POA: Insufficient documentation

## 2019-08-01 DIAGNOSIS — G8929 Other chronic pain: Secondary | ICD-10-CM | POA: Insufficient documentation

## 2019-08-01 DIAGNOSIS — Z79899 Other long term (current) drug therapy: Secondary | ICD-10-CM | POA: Insufficient documentation

## 2019-08-01 MED ORDER — MELOXICAM 15 MG PO TABS: 15.0000 mg | ORAL_TABLET | Freq: Every day | ORAL | 0 refills | Status: AC

## 2019-08-01 NOTE — ED Provider Notes (Addendum)
[facility] Emergency Department Provider Note  ____________________________________________  Time seen: Approximately 7:16 PM  I have reviewed the triage vital signs and the nursing notes.   HISTORY  Chief Complaint Foot Pain    HPI Tara Pope is a 43 y.o. female that presents emergency department for evaluation of right shoulder pain since May and left ankle pain since January.  Patient states that right shoulder started hurting when a tree fell on to shoulder.  She is able to move her shoulder without too much difficulty but occasionally has sharp pains in this shoulder. Pain is worse when she raises her arm. Left ankle hurts worse when patient is working.  She does not have any pain when she is not putting pressure on her foot.  She is on her feet all day for work.    Past Medical History:  Diagnosis Date  . Placenta previa 10/13/2015  . Previous cesarean delivery, antepartum condition or complication 3/41/9379   X 2     Patient Active Problem List   Diagnosis Date Noted  . Hyperlipidemia 07/09/2018  . Impaired fasting glucose 07/09/2018  . GERD (gastroesophageal reflux disease) 07/09/2018  . AMA (advanced maternal age) multigravida 35+ 12/28/2015    Past Surgical History:  Procedure Laterality Date  . APPENDECTOMY N/A 10/13/2015   Procedure: APPENDECTOMY;  Surgeon: Christene Lye, MD;  Location: [facility] ORS;  Service: General;  Laterality: N/A;  . CESAREAN SECTION     x2  . CESAREAN SECTION N/A 01/01/2016   Procedure: CESAREAN SECTION;  Surgeon: Donnamae Jude, MD;  Location: [place] ORS;  Service: Obstetrics;  Laterality: N/A;    Prior to Admission medications   Medication Sig Start Date End Date Taking? Authorizing Provider  medroxyPROGESTERone (DEPO-PROVERA) 150 MG/ML injection Inject 150 mg into the muscle every 3 (three) months.    [provider]  meloxicam (MOBIC) 15 MG tablet Take 1 tablet (15 mg total) by mouth daily for 10  days. 08/01/19 08/11/19  Laban Emperor, PA-C  omeprazole (PRILOSEC) 20 MG capsule Take 1 capsule (20 mg total) by mouth daily. 07/09/18 07/09/19  Jean Rosenthal, MD  Prenatal Vit-Fe Fumarate-FA (MULTIVITAMIN-PRENATAL) 27-0.8 MG TABS tablet Take 1 tablet by mouth daily at 12 noon.    [provider]  rosuvastatin (CRESTOR) 20 MG tablet Take 1 tablet (20 mg total) by mouth at bedtime. 07/09/18 07/09/19  Jean Rosenthal, MD    Allergies Patient has no known allergies.  No family history on file.  Social History Social History   Tobacco Use  . Smoking status: Never Smoker  . Smokeless tobacco: Never Used  Substance Use Topics  . Alcohol use: No  . Drug use: No     Review of Systems  Constitutional: No fever/chills Cardiovascular: No chest pain. Respiratory: No SOB. Gastrointestinal: No abdominal pain.  No nausea, no vomiting.  Musculoskeletal: See HPI. Skin: Negative for rash, abrasions, lacerations, ecchymosis. Neurological: Negative for headaches, numbness or tingling   ____________________________________________   PHYSICAL EXAM:  VITAL SIGNS: ED Triage Vitals  Enc Vitals Group     BP 08/01/19 1152 116/77     Pulse Rate 08/01/19 1152 78     Resp 08/01/19 1152 16     Temp 08/01/19 1152 98.7 F (37.1 C)     Temp Source 08/01/19 1152 Oral     SpO2 08/01/19 1152 95 %     Weight 08/01/19 1156 204 lb 12.9 oz (92.9 kg)     Height --  Head Circumference --      Peak Flow --      Pain Score 08/01/19 1155 8     Pain Loc --      Pain Edu? --      Excl. in GC? --      Constitutional: Alert and oriented. Well appearing and in no acute distress. Eyes: Conjunctivae are normal. PERRL. EOMI. Head: Atraumatic. ENT:      Ears:      Nose: No congestion/rhinnorhea.      Mouth/Throat: Mucous membranes are moist.  Neck: No stridor.  No cervical spine tenderness to palpation. Cardiovascular: Normal rate, regular rhythm.  Good peripheral circulation. Respiratory: Normal  respiratory effort without tachypnea or retractions. Lungs CTAB. Good air entry to the bases with no decreased or absent breath sounds. Musculoskeletal: Full range of motion to all extremities. No gross deformities appreciated.  Full range of motion of right shoulder.  Pain elicited with abduction of right shoulder.  Strength equal in upper extremities bilaterally.  Full range of motion of left ankle.  Patient points to medial and lateral malleolus as areas of pain.  No swelling, ecchymosis, erythema.  Normal gait. Neurologic:  Normal speech and language. No gross focal neurologic deficits are appreciated.  Skin:  Skin is warm, dry and intact. No rash noted. Psychiatric: Mood and affect are normal. Speech and behavior are normal. Patient exhibits appropriate insight and judgement.   ____________________________________________   LABS (all labs ordered are listed, but only abnormal results are displayed)  Labs Reviewed - No data to display ____________________________________________  EKG   ____________________________________________  RADIOLOGY Lexine BatonI, Estel Tonelli, personally viewed and evaluated these images (plain radiographs) as part of my medical decision making, as well as reviewing the written report by the radiologist.  Dg Shoulder Right  Result Date: 08/01/2019 CLINICAL DATA:  Right shoulder pain since May 2020. EXAM: RIGHT SHOULDER - 2+ VIEW COMPARISON:  None. FINDINGS: There is no evidence of fracture or dislocation. There is no evidence of arthropathy or other focal bone abnormality. There is a small focal calcification in the distal rotator cuff seen on the axillary view. No other abnormalities. IMPRESSION: Calcific tendinopathy of the rotator cuff.  Otherwise CLINICAL DATA:  Right shoulder pain since May 2020. EXAM: RIGHT SHOULDER - 2+ VIEW COMPARISON:  None. FINDINGS: There is no evidence of fracture or dislocation. There is no evidence of arthropathy or other focal bone  abnormality. There is a small focal calcification in the distal rotator cuff seen on the axillary view. No other abnormalities. IMPRESSION: Minimal degenerative changes of the distal rotator cuff. Normal. Electronically Signed   By: Francene BoyersJames  Maxwell M.D.   On: 08/01/2019 13:24   Dg Ankle Complete Left  Result Date: 08/01/2019 CLINICAL DATA:  43 year old female with left ankle pain. EXAM: LEFT ANKLE COMPLETE - 3+ VIEW COMPARISON:  None. FINDINGS: There is no evidence of fracture, dislocation, or joint effusion. There is no evidence of arthropathy or other focal bone abnormality. Soft tissues are unremarkable. IMPRESSION: Negative. Electronically Signed   By: Elgie CollardArash  Radparvar M.D.   On: 08/01/2019 13:23    ____________________________________________    PROCEDURES  Procedure(s) performed:    Procedures    Medications - No data to display   ____________________________________________   INITIAL IMPRESSION / ASSESSMENT AND PLAN / ED COURSE  Pertinent labs & imaging results that were available during my care of the patient were reviewed by me and considered in my medical decision making (see chart for details).  Review of the [place] CSRS was performed in accordance of the [facility] prior to dispensing any controlled drugs.     Patient presents emergency department for evaluation of right shoulder pain since May and left ankle pain since January.  Right shoulder x-ray shows degenerative changes of the distal rotator cuff.  Ankle x-ray is negative for acute bony abnormalities.  Ankle Ace wrap was placed.  Arm sling was given.  Patient will be discharged home with prescriptions for Mobic. Patient is to follow up with orthopedics as directed. Patient is given ED precautions to return to the ED for any worsening or new symptoms.     ____________________________________________  FINAL CLINICAL IMPRESSION(S) / ED DIAGNOSES  Final diagnoses:  Chronic pain of left ankle  Chronic right shoulder  pain      NEW MEDICATIONS STARTED DURING THIS VISIT:  ED Discharge Orders         Ordered    meloxicam (MOBIC) 15 MG tablet  Daily     08/01/19 1424              This chart was dictated using voice recognition software/Dragon. Despite best efforts to proofread, errors can occur which can change the meaning. Any change was purely unintentional.    Enid Derry, PA-C 08/02/19 1601    Enid Derry, PA-C 08/02/19 1601    Miguel Aschoff., MD 08/06/19 0800

## 2019-08-01 NOTE — ED Triage Notes (Addendum)
C/O left heel pain x months.  Denies injury.  States pain in intermittent.  Also c/O right shoulder pain since may.  Pain started after grabbing a heavy branch.

## 2020-07-04 ENCOUNTER — Emergency Department: Payer: HRSA Program

## 2020-07-04 ENCOUNTER — Emergency Department
Admission: EM | Admit: 2020-07-04 | Discharge: 2020-07-04 | Disposition: A | Discharge status: Home / Self Care | Payer: HRSA Program | Attending: Emergency Medicine | Admitting: Emergency Medicine

## 2020-07-04 ENCOUNTER — Encounter: Payer: Self-pay | Admitting: Emergency Medicine

## 2020-07-04 ENCOUNTER — Other Ambulatory Visit: Payer: Self-pay

## 2020-07-04 DIAGNOSIS — B349 Viral infection, unspecified: Secondary | ICD-10-CM | POA: Diagnosis present

## 2020-07-04 DIAGNOSIS — U071 COVID-19: Secondary | ICD-10-CM | POA: Diagnosis not present

## 2020-07-04 LAB — URINALYSIS, COMPLETE (UACMP) WITH MICROSCOPIC
Bilirubin Urine: NEGATIVE
Glucose, UA: NEGATIVE mg/dL
Ketones, ur: NEGATIVE mg/dL
Leukocytes,Ua: NEGATIVE
Nitrite: NEGATIVE
Protein, ur: NEGATIVE mg/dL
Specific Gravity, Urine: 1.011 (ref 1.005–1.030)
pH: 6 (ref 5.0–8.0)

## 2020-07-04 LAB — SARS CORONAVIRUS 2 BY RT PCR (HOSPITAL ORDER, PERFORMED IN ~~LOC~~ HOSPITAL LAB): SARS Coronavirus 2: POSITIVE — AB

## 2020-07-04 LAB — POCT PREGNANCY, URINE: Preg Test, Ur: NEGATIVE

## 2020-07-04 MED ORDER — ALBUTEROL SULFATE HFA 108 (90 BASE) MCG/ACT IN AERS: 2.0000 | INHALATION_SPRAY | Freq: Four times a day (QID) | RESPIRATORY_TRACT | 0 refills | Status: AC | PRN

## 2020-07-04 MED ORDER — BENZONATATE 100 MG PO CAPS: 100.0000 mg | ORAL_CAPSULE | Freq: Three times a day (TID) | ORAL | 0 refills | Status: AC | PRN

## 2020-07-04 NOTE — ED Provider Notes (Signed)
Emergency Department Provider Note  ____________________________________________  Time seen: Approximately 8:16 PM  I have reviewed the triage vital signs and the nursing notes.   HISTORY  Chief Complaint viral sx   Historian Patient     HPI Tara Pope is a 44 y.o. female presents to the emergency department with headache nausea, diarrhea, pharyngitis and loss of taste and smell for approximately 1 week.  Patient denies chest pain, chest tightness or shortness of breath.  There are sick contacts within her home with similar symptoms.  She denies recent travel.  No other alleviating measures attempted.   Past Medical History:  Diagnosis Date  . Placenta previa 10/13/2015  . Previous cesarean delivery, antepartum condition or complication 12/28/2015   X 2      Immunizations up to date:  Yes.     Past Medical History:  Diagnosis Date  . Placenta previa 10/13/2015  . Previous cesarean delivery, antepartum condition or complication 12/28/2015   X 2     Patient Active Problem List   Diagnosis Date Noted  . Hyperlipidemia 07/09/2018  . Impaired fasting glucose 07/09/2018  . GERD (gastroesophageal reflux disease) 07/09/2018  . AMA (advanced maternal age) multigravida 35+ 12/28/2015    Past Surgical History:  Procedure Laterality Date  . APPENDECTOMY N/A 10/13/2015   Procedure: APPENDECTOMY;  Surgeon: Kieth Brightly, MD;  Location: [facility] ORS;  Service: General;  Laterality: N/A;  . CESAREAN SECTION     x2  . CESAREAN SECTION N/A 01/01/2016   Procedure: CESAREAN SECTION;  Surgeon: Reva Bores, MD;  Location: [facility] ORS;  Service: Obstetrics;  Laterality: N/A;    Prior to Admission medications   Medication Sig Start Date End Date Taking? Authorizing Provider  albuterol (VENTOLIN HFA) 108 (90 Base) MCG/ACT inhaler Inhale 2 puffs into the lungs every 6 (six) hours as needed for wheezing or shortness of breath. 07/04/20   Orvil Feil, PA-C  benzonatate  (TESSALON PERLES) 100 MG capsule Take 1 capsule (100 mg total) by mouth 3 (three) times daily as needed for up to 7 days for cough. 07/04/20 07/11/20  Orvil Feil, PA-C  medroxyPROGESTERone (DEPO-PROVERA) 150 MG/ML injection Inject 150 mg into the muscle every 3 (three) months.    [provider]  omeprazole (PRILOSEC) 20 MG capsule Take 1 capsule (20 mg total) by mouth daily. 07/09/18 07/09/19  Yvette Rack, MD  Prenatal Vit-Fe Fumarate-FA (MULTIVITAMIN-PRENATAL) 27-0.8 MG TABS tablet Take 1 tablet by mouth daily at 12 noon.    [provider]  rosuvastatin (CRESTOR) 20 MG tablet Take 1 tablet (20 mg total) by mouth at bedtime. 07/09/18 07/09/19  Yvette Rack, MD    Allergies Patient has no known allergies.  History reviewed. No pertinent family history.  Social History Social History   Tobacco Use  . Smoking status: Never Smoker  . Smokeless tobacco: Never Used  Vaping Use  . Vaping Use: Never used  Substance Use Topics  . Alcohol use: No  . Drug use: No      Review of Systems  Constitutional: Patient has fever.  Eyes: No visual changes. No discharge ENT: Patient has congestion.  Cardiovascular: no chest pain. Respiratory: Patient has cough.  Gastrointestinal: No abdominal pain. Patient has nausea and diarrhea.  Genitourinary: Negative for dysuria. No hematuria Musculoskeletal: Patient has myalgias.  Skin: Negative for rash, abrasions, lacerations, ecchymosis. Neurological: Patient has headache, no focal weakness or numbness.      ____________________________________________   PHYSICAL EXAM:  VITAL SIGNS: ED Triage Vitals  Enc Vitals Group     BP 07/04/20 1722 134/84     Pulse Rate 07/04/20 1722 86     Resp 07/04/20 1722 16     Temp 07/04/20 1722 98.5 F (36.9 C)     Temp Source 07/04/20 1722 Oral     SpO2 07/04/20 1722 97 %     Weight --      Height --      Head Circumference --      Peak Flow --      Pain Score 07/04/20 1723 10      Pain Loc --      Pain Edu? --      Excl. in GC? --      Constitutional: Alert and oriented. Patient is lying supine. Eyes: Conjunctivae are normal. PERRL. EOMI. Head: Atraumatic. ENT:      Ears: Tympanic membranes are mildly injected with mild effusion bilaterally.       Nose: No congestion/rhinnorhea.      Mouth/Throat: Mucous membranes are moist. Posterior pharynx is mildly erythematous.  Hematological/Lymphatic/Immunilogical: No cervical lymphadenopathy.  Cardiovascular: Normal rate, regular rhythm. Normal S1 and S2.  Good peripheral circulation. Respiratory: Normal respiratory effort without tachypnea or retractions. Lungs CTAB. Good air entry to the bases with no decreased or absent breath sounds. Gastrointestinal: Bowel sounds 4 quadrants. Soft and nontender to palpation. No guarding or rigidity. No palpable masses. No distention. No CVA tenderness. Musculoskeletal: Full range of motion to all extremities. No gross deformities appreciated. Neurologic:  Normal speech and language. No gross focal neurologic deficits are appreciated.  Skin:  Skin is warm, dry and intact. No rash noted. Psychiatric: Mood and affect are normal. Speech and behavior are normal. Patient exhibits appropriate insight and judgement.   ____________________________________________   LABS (all labs ordered are listed, but only abnormal results are displayed)  Labs Reviewed  SARS CORONAVIRUS 2 BY RT PCR (HOSPITAL ORDER, PERFORMED IN [facility]) - Abnormal; Notable for the following components:      Result Value   SARS Coronavirus 2 POSITIVE (*)    All other components within normal limits  URINALYSIS, COMPLETE (UACMP) WITH MICROSCOPIC - Abnormal; Notable for the following components:   Color, Urine YELLOW (*)    APPearance HAZY (*)    Hgb urine dipstick SMALL (*)    Bacteria, UA RARE (*)    All other components within normal limits  POCT PREGNANCY, URINE  POC URINE PREG, ED    ____________________________________________  EKG   ____________________________________________  RADIOLOGY Geraldo Pitter, personally viewed and evaluated these images (plain radiographs) as part of my medical decision making, as well as reviewing the written report by the radiologist.  DG Chest 1 View  Result Date: 07/04/2020 CLINICAL DATA:  Cough EXAM: CHEST  1 VIEW COMPARISON:  04/06/2011 FINDINGS: Bibasilar atelectasis. Heart is normal size. No effusions or edema. No acute bony abnormality. IMPRESSION: Bibasilar atelectasis. Electronically Signed   By: Charlett Nose M.D.   On: 07/04/2020 17:41    ____________________________________________    PROCEDURES  Procedure(s) performed:     Procedures     Medications - No data to display   ____________________________________________   INITIAL IMPRESSION / ASSESSMENT AND PLAN / ED COURSE  Pertinent labs & imaging results that were available during my care of the patient were reviewed by me and considered in my medical decision making (see chart for details).      Assessment and plan  COVID-[age] year old female presents to the emergency department with headache, nausea, diarrhea, pharyngitis and loss of taste and smell for the past week.  Vital signs are reassuring at triage.  On physical exam, patient had no increased work of breathing or adventitious lung sounds auscultated.   Patient tested positive for COVID-19.  Patient was discharged with [facility] and albuterol.  Return precautions were given to return with new or worsening symptoms.  ____________________________________________  FINAL CLINICAL IMPRESSION(S) / ED DIAGNOSES  Final diagnoses:  COVID-19      NEW MEDICATIONS STARTED DURING THIS VISIT:  ED Discharge Orders         Ordered    albuterol (VENTOLIN HFA) 108 (90 Base) MCG/ACT inhaler  Every 6 hours PRN     Discontinue  Reprint     07/04/20 2003    benzonatate (TESSALON PERLES)  100 MG capsule  3 times daily PRN     Discontinue  Reprint     07/04/20 2003              This chart was dictated using voice recognition software/Dragon. Despite best efforts to proofread, errors can occur which can change the meaning. Any change was purely unintentional.     Orvil Feil, PA-C 07/04/20 2020    Phineas Semen, MD 07/04/20 2234

## 2020-07-04 NOTE — ED Notes (Signed)
Pt assessed with interpretor.  Pt here for headache, nausea, some diarrhea, sore throat, loss of taste and smell as well as chills and lower back pain. Unlabored. VSS. NAD. Skin warm dry and pink.  Afebrile here. Sx started a little over a week ago.  Ambulatory without increased respiratory distress.

## 2020-07-04 NOTE — Discharge Instructions (Addendum)
Tara Pope is much as possible for the next 7 to 10 days. You have been prescribed an albuterol inhaler and Tessalon Perles.

## 2020-07-04 NOTE — ED Triage Notes (Signed)
Here for covid sx. Sore throat, headache, chills, loss of taste and smell, back pain.  NAD.

## 2020-07-05 ENCOUNTER — Telehealth: Payer: Self-pay | Admitting: Physician Assistant

## 2020-07-05 NOTE — Telephone Encounter (Signed)
Called to discuss with Roselyn Reef about Covid symptoms and the use of bamlanivimab/etesevimab or casirivimab/imdevimab, a monoclonal antibody infusion for those with mild to moderate Covid symptoms and at a high risk of hospitalization.      Pt does not qualify for infusion therapy as pt's symptoms first presented > 10 days prior to timing of infusion. Symptoms tier reviewed as well as criteria for ending isolation. Preventative practices reviewed. Patient verbalized understanding    Patient Active Problem List   Diagnosis Date Noted  . Hyperlipidemia 07/09/2018  . Impaired fasting glucose 07/09/2018  . GERD (gastroesophageal reflux disease) 07/09/2018  . AMA (advanced maternal age) multigravida 35+ 12/28/2015    Cline Crock PA-C

## 2021-08-12 ENCOUNTER — Other Ambulatory Visit: Payer: Self-pay | Admitting: Obstetrics & Gynecology

## 2021-08-12 DIAGNOSIS — Z1231 Encounter for screening mammogram for malignant neoplasm of breast: Secondary | ICD-10-CM

## 2021-08-17 ENCOUNTER — Ambulatory Visit
Admission: RE | Admit: 2021-08-17 | Discharge: 2021-08-17 | Disposition: A | Discharge status: Home / Self Care | Payer: No Typology Code available for payment source | Source: Ambulatory Visit | Attending: Obstetrics & Gynecology | Admitting: Obstetrics & Gynecology

## 2021-08-17 ENCOUNTER — Other Ambulatory Visit: Payer: Self-pay

## 2021-08-17 DIAGNOSIS — Z1231 Encounter for screening mammogram for malignant neoplasm of breast: Secondary | ICD-10-CM

## 2021-11-24 IMAGING — MG MM DIGITAL SCREENING BILAT W/ TOMO AND CAD
8 series · 8 of 24 positions shown · non-contrast
Comparison: Previous exam(s).

CLINICAL DATA: Screening.

EXAM:
DIGITAL SCREENING BILATERAL MAMMOGRAM WITH TOMOSYNTHESIS AND CAD
TECHNIQUE: Bilateral screening digital craniocaudal and mediolateral oblique
mammograms were obtained. Bilateral screening digital breast
tomosynthesis was performed. The images were evaluated with
computer-aided detection.

[L MLO synth-2D]
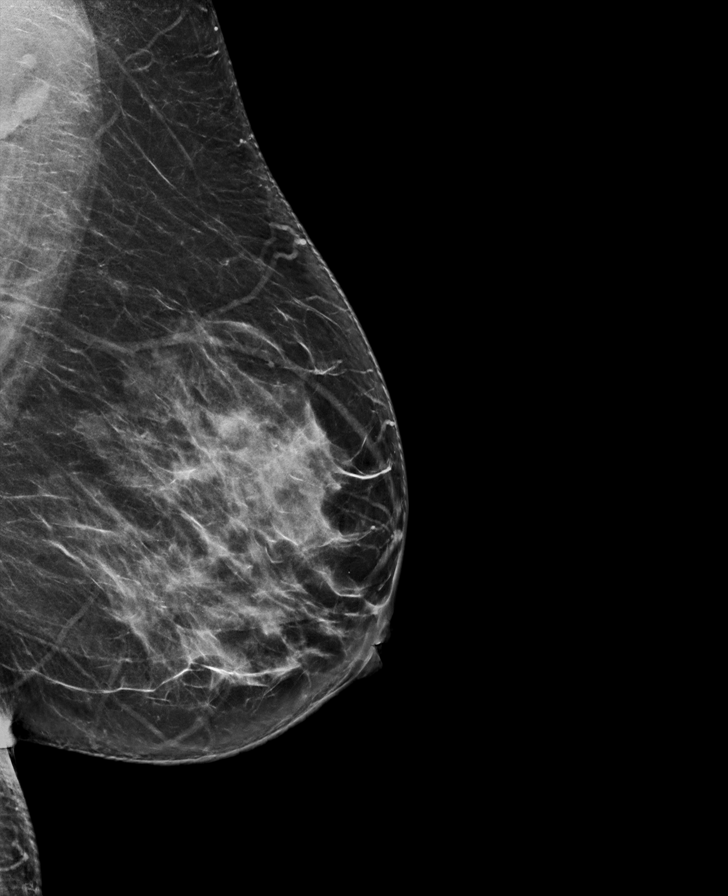

[R CC synth-2D]
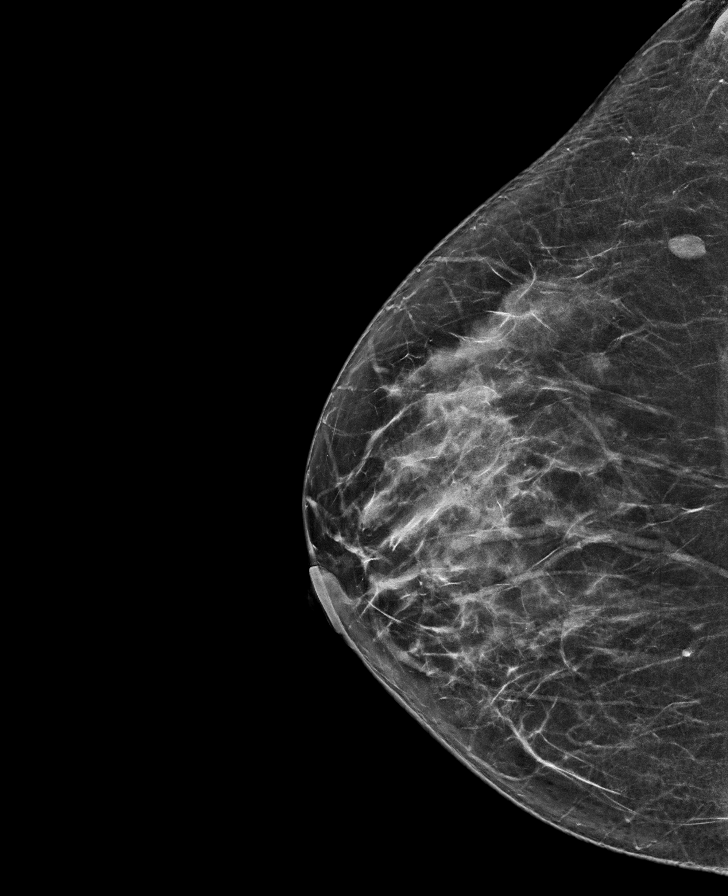

[R MLO synth-2D]
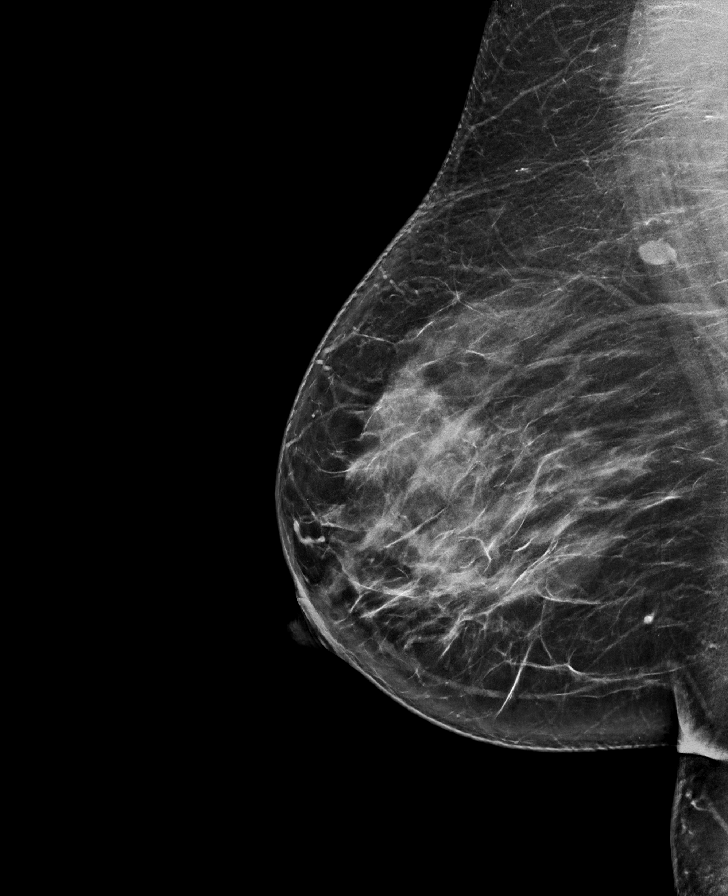

[L CC synth-2D]
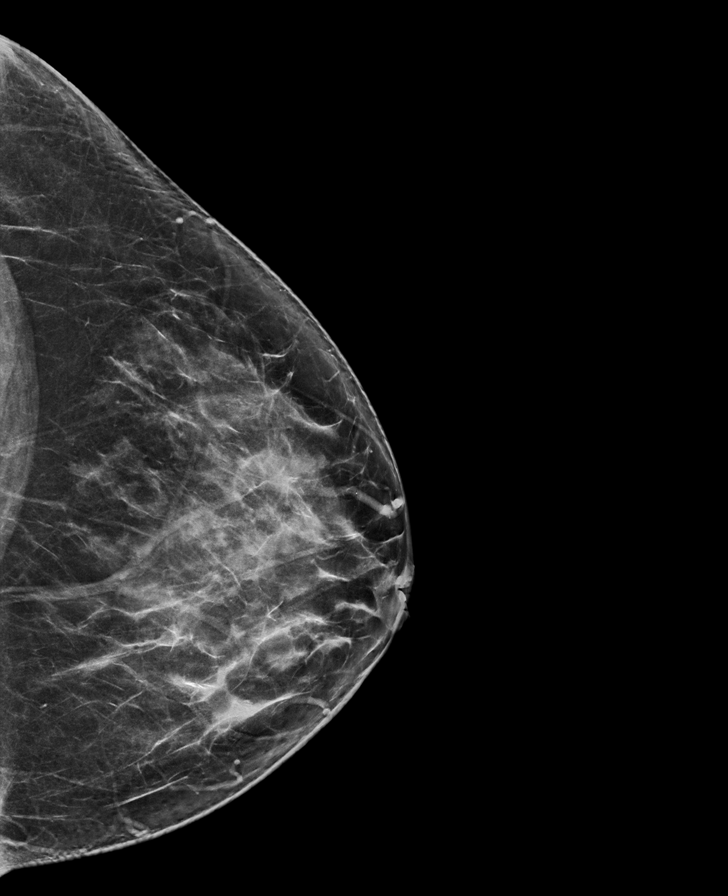

[L MLO tomo · tomo slice 41/81.0]
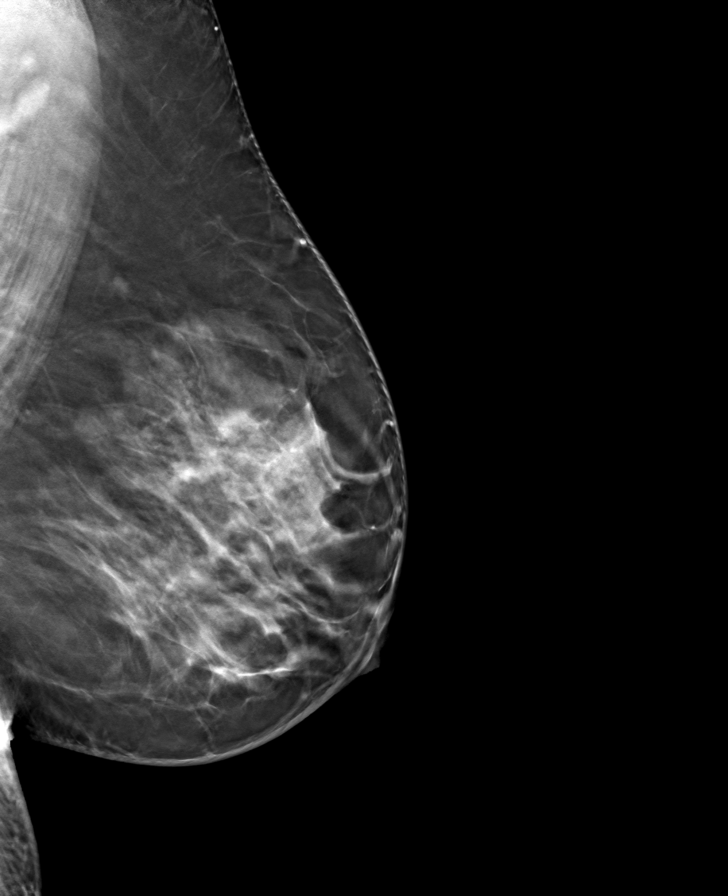

[L CC tomo · tomo slice 39/78.0]
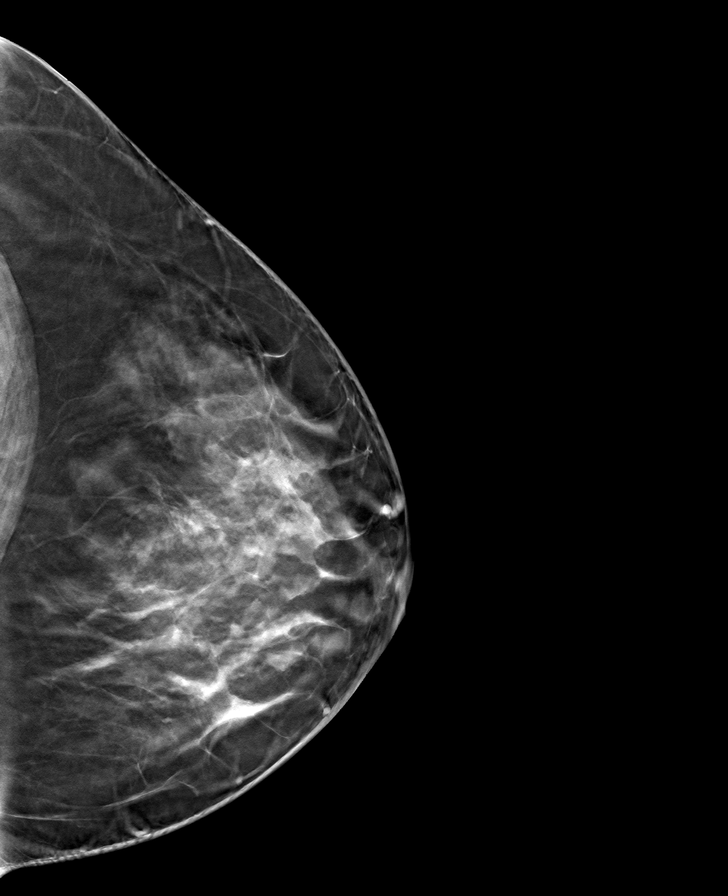

[R MLO tomo · tomo slice 41/82.0]
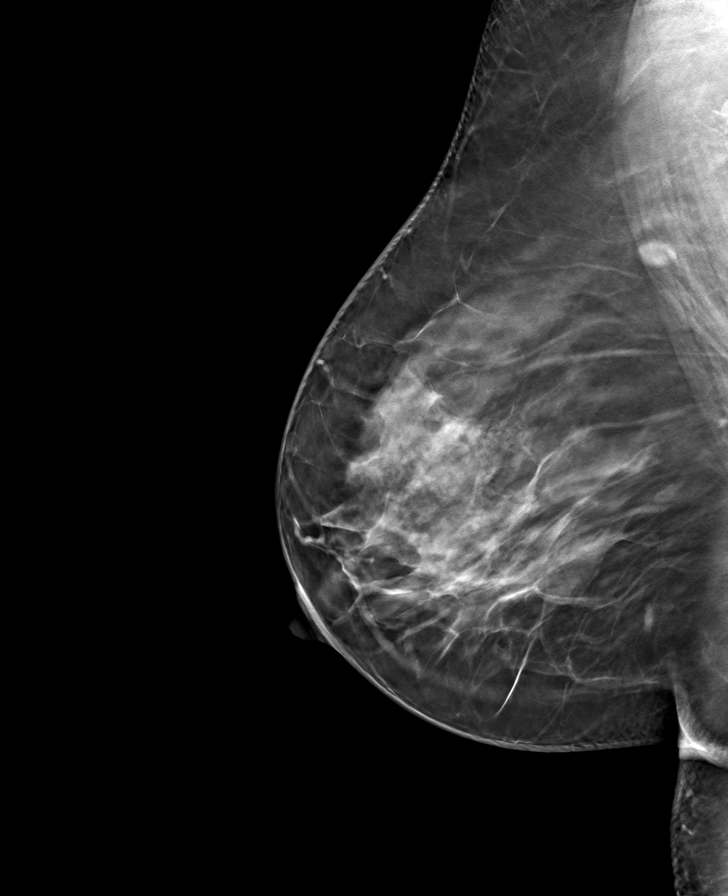

[R CC tomo · tomo slice 37/74.0]
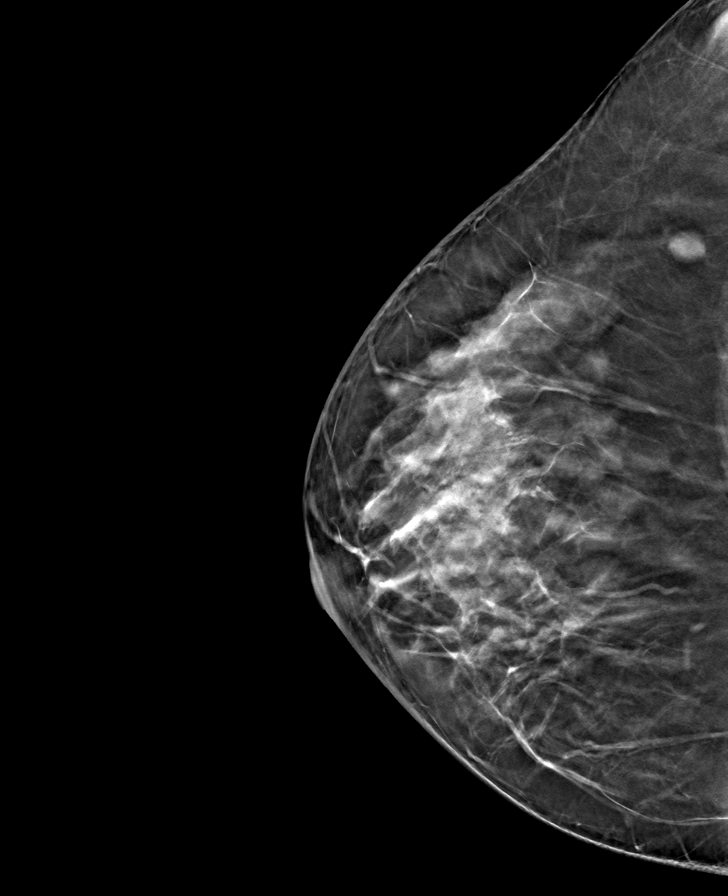

[8 of 24 positions shown; findings below may reference images not displayed]

ACR Breast Density Category c: The breast tissue is heterogeneously
dense, which may obscure small masses.
FINDINGS: There are no findings suspicious for malignancy.
IMPRESSION: No mammographic evidence of malignancy. A result letter of this
screening mammogram will be mailed directly to the patient.

RECOMMENDATION:
Screening mammogram in one year. (Code:Q3-W-BC3)

BI-RADS CATEGORY  1: Negative.

## 2022-02-03 ENCOUNTER — Encounter: Payer: Self-pay | Admitting: Emergency Medicine

## 2022-02-03 ENCOUNTER — Emergency Department: Payer: No Typology Code available for payment source

## 2022-02-03 ENCOUNTER — Other Ambulatory Visit: Payer: Self-pay

## 2022-02-03 ENCOUNTER — Emergency Department
Admission: EM | Admit: 2022-02-03 | Discharge: 2022-02-03 | Disposition: A | Discharge status: Home / Self Care | Payer: No Typology Code available for payment source | Attending: Emergency Medicine | Admitting: Emergency Medicine

## 2022-02-03 DIAGNOSIS — G44209 Tension-type headache, unspecified, not intractable: Secondary | ICD-10-CM | POA: Insufficient documentation

## 2022-02-03 LAB — CBG MONITORING, ED: Glucose-Capillary: 115 mg/dL — ABNORMAL HIGH (ref 70–99)

## 2022-02-03 MED ORDER — PROCHLORPERAZINE EDISYLATE 10 MG/2ML IJ SOLN
10.0000 mg | Freq: Once | INTRAMUSCULAR | Status: DC
  Filled 2022-02-03: qty 2

## 2022-02-03 MED ORDER — KETOROLAC TROMETHAMINE 60 MG/2ML IM SOLN
60.0000 mg | Freq: Once | INTRAMUSCULAR | Status: DC
  Filled 2022-02-03: qty 2

## 2022-02-03 MED ORDER — SODIUM CHLORIDE 0.9 % IV BOLUS
1000.0000 mL | Freq: Once | INTRAVENOUS | Status: AC
  Administered 2022-02-03: 14:00:00 1000 mL via INTRAVENOUS

## 2022-02-03 MED ORDER — PROCHLORPERAZINE EDISYLATE 10 MG/2ML IJ SOLN
10.0000 mg | Freq: Once | INTRAMUSCULAR | Status: AC
  Administered 2022-02-03: 14:00:00 10 mg via INTRAVENOUS

## 2022-02-03 MED ORDER — DIPHENHYDRAMINE HCL 50 MG/ML IJ SOLN
25.0000 mg | Freq: Once | INTRAMUSCULAR | Status: AC
  Administered 2022-02-03: 14:00:00 25 mg via INTRAVENOUS

## 2022-02-03 MED ORDER — CYCLOBENZAPRINE HCL 10 MG PO TABS: 10.0000 mg | ORAL_TABLET | Freq: Three times a day (TID) | ORAL | 0 refills | Status: AC | PRN

## 2022-02-03 MED ORDER — BUTALBITAL-APAP-CAFFEINE 50-325-40 MG PO TABS: 1.0000 | ORAL_TABLET | Freq: Four times a day (QID) | ORAL | 0 refills | Status: AC | PRN

## 2022-02-03 MED ORDER — DIPHENHYDRAMINE HCL 50 MG/ML IJ SOLN
25.0000 mg | Freq: Once | INTRAMUSCULAR | Status: DC
  Filled 2022-02-03: qty 1

## 2022-02-03 MED ORDER — KETOROLAC TROMETHAMINE 30 MG/ML IJ SOLN
15.0000 mg | Freq: Once | INTRAMUSCULAR | Status: AC
  Administered 2022-02-03: 14:00:00 15 mg via INTRAVENOUS

## 2022-02-03 NOTE — ED Notes (Signed)
This RN attempted unsuccessfully x2 to obtain PIV access. ?

## 2022-02-03 NOTE — ED Provider Notes (Signed)
? ?[facility] ?Provider Note ? ? ? Event Date/Time  ? First MD Initiated Contact with Patient 02/03/22 1230   ?  (approximate) ? ? ?History  ? ?Headache ? ? ?HPI ? ?Tara Pope is a 46 y.o. female here with headache.  The patient states that over the last several days, she has had aching, throbbing, bitemporal headache.  She describes it as a squeezing-like sensation.  She states that she has been under significant stress at home, and feels that it is related to this.  She had some associated difficulty sleeping.  Denies any focal numbness or weakness.  No photophobia.  No fevers or chills.  No recent illnesses.  No sick contacts.  She has a history of chronic migraines, but these often get better with Tylenol and she is taking this and has not necessarily improved her current symptoms.  History obtained with an interpreter. ?  ? ? ?Physical Exam  ? ?Triage Vital Signs: ?ED Triage Vitals  ?Enc Vitals Group  ?   BP 02/03/22 1114 121/83  ?   Pulse Rate 02/03/22 1114 82  ?   Resp 02/03/22 1114 16  ?   Temp 02/03/22 1114 98.7 ?F (37.1 ?C)  ?   Temp Source 02/03/22 1114 Oral  ?   SpO2 02/03/22 1114 96 %  ?   Weight 02/03/22 1113 204 lb 12.9 oz (92.9 kg)  ?   Height 02/03/22 1113 5\' 1"  (1.549 m)  ?   Head Circumference --   ?   Peak Flow --   ?   Pain Score 02/03/22 1123 8  ?   Pain Loc --   ?   Pain Edu? --   ?   Excl. in GC? --   ? ? ?Most recent vital signs: ?Vitals:  ? 02/03/22 1114  ?BP: 121/83  ?Pulse: 82  ?Resp: 16  ?Temp: 98.7 ?F (37.1 ?C)  ?SpO2: 96%  ? ? ? ?General: Awake, no distress.  ?CV:  Good peripheral perfusion.  Regular rate and rhythm. ?Resp:  Normal effort.  Lungs clear bilaterally. ?Abd:  No distention.  ?Other:  Cranial nerves II through XII intact.  Strength out of 5 bilateral upper and lower extremities.  Normal sensation light touch.  No focal neurological deficits. ? ? ?ED Results / Procedures / Treatments  ? ?Labs ?(all labs ordered are listed, but only abnormal results  are displayed) ?Labs Reviewed  ?CBG MONITORING, ED - Abnormal; Notable for the following components:  ?    Result Value  ? Glucose-Capillary 115 (*)   ? All other components within normal limits  ?POC URINE PREG, ED  ? ? ? ?EKG ? ? ? ?RADIOLOGY ?CT head: No acute intracranial findings ? ? ?I also independently reviewed and agree wit radiologist interpretations. ? ? ?PROCEDURES: ? ?Critical Care performed: No ? ?MEDICATIONS ORDERED IN ED: ?Medications  ?sodium chloride 0.9 % bolus 1,000 mL (1,000 mLs Intravenous New Bag/Given 02/03/22 1428)  ?prochlorperazine (COMPAZINE) injection 10 mg (10 mg Intravenous Given 02/03/22 1429)  ?ketorolac (TORADOL) 30 MG/ML injection 15 mg (15 mg Intravenous Given 02/03/22 1429)  ?diphenhydrAMINE (BENADRYL) injection 25 mg (25 mg Intravenous Given 02/03/22 1429)  ? ? ? ?IMPRESSION / MDM / ASSESSMENT AND PLAN / ED COURSE  ?I reviewed the triage vital signs and the nursing notes. ?             ?               ? ? ?  The patient is on the cardiac monitor to evaluate for evidence of arrhythmia and/or significant heart rate changes. ? ? ?MDM:  ?46 year old female here with suspected primary headache syndrome, likely tension type in the setting of recent stressors, versus acute on chronic migraines.  She has no red flags.  No fevers, meningismus, or signs to suggest meningitis or encephalitis.  She has no focal neurological deficits or signs of mass.  CT head obtained, reviewed, shows no significant abnormalities.  She has some chronic sinusitis but has no signs of active sinusitis clinically.  No fevers or chills.  She is not immunosuppressed.  Will treat with analgesia, outpatient follow-up. No indication for admission. Discussed sleep habits, attempts to reduce stress. Return precautions given. ? ? ?MEDICATIONS GIVEN IN ED: ?Medications  ?sodium chloride 0.9 % bolus 1,000 mL (1,000 mLs Intravenous New Bag/Given 02/03/22 1428)  ?prochlorperazine (COMPAZINE) injection 10 mg (10 mg Intravenous Given  02/03/22 1429)  ?ketorolac (TORADOL) 30 MG/ML injection 15 mg (15 mg Intravenous Given 02/03/22 1429)  ?diphenhydrAMINE (BENADRYL) injection 25 mg (25 mg Intravenous Given 02/03/22 1429)  ? ? ? ?Consults:  ? ? ? ?EMR reviewed  ?No acute/relevant ? ? ? ? ?FINAL CLINICAL IMPRESSION(S) / ED DIAGNOSES  ? ?Final diagnoses:  ?Tension headache  ? ? ? ?Rx / DC Orders  ? ?ED Discharge Orders   ? ?      Ordered  ?  cyclobenzaprine (FLEXERIL) 10 MG tablet  3 times daily PRN       ? 02/03/22 1525  ?  butalbital-acetaminophen-caffeine (FIORICET) 50-325-40 MG tablet  Every 6 hours PRN       ? 02/03/22 1525  ? ?  ?  ? ?  ? ? ? ?Note:  This document was prepared using Dragon voice recognition software and may include unintentional dictation errors. ?  ?Shaune Pollack, MD ?02/03/22 1526 ? ?

## 2022-02-03 NOTE — ED Notes (Signed)
Pt transported back from CT via WC with CT tech. °

## 2022-02-03 NOTE — ED Notes (Signed)
Pt transported to CT via WC with CT tech. °

## 2022-02-03 NOTE — ED Notes (Signed)
ED Provider at bedside. 

## 2022-02-03 NOTE — ED Notes (Signed)
Pt c/o HA x1 week. Pt states she usually takes Tylenol & her headaches will go away, but that is not working this time. No hx migraines. ?

## 2022-02-03 NOTE — ED Triage Notes (Signed)
Pt comes into the ED via POV c/o headache x 1 week.  Pt denies ever having been given Rx for migraines.  Pt normally takes tylenol and her headaches will go away, but this one has not.  Pt also admits that she started having blurred vision yesterday.  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  Pt currently neurologically intact.  ?

## 2022-05-13 IMAGING — CT CT HEAD W/O CM
4 series · 16 of 47 positions shown, 18 images · non-contrast
Comparison: None.

CLINICAL DATA: Headaches



[Series 2: head bone · axial · 0.40mm/px · z∈[-114,-82]mm · 3 of 78 slices shown]
[im 8/78  bone]
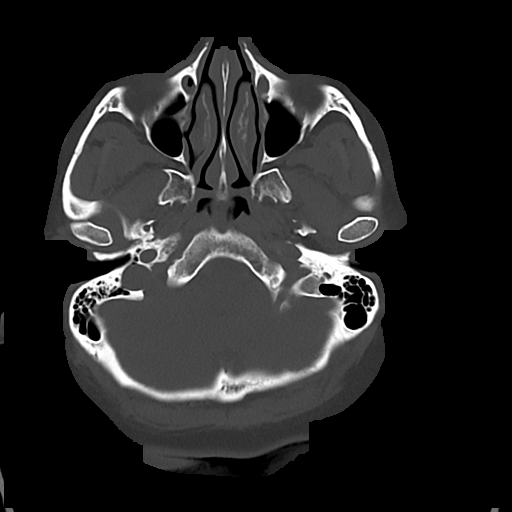
[im 16/78  bone]
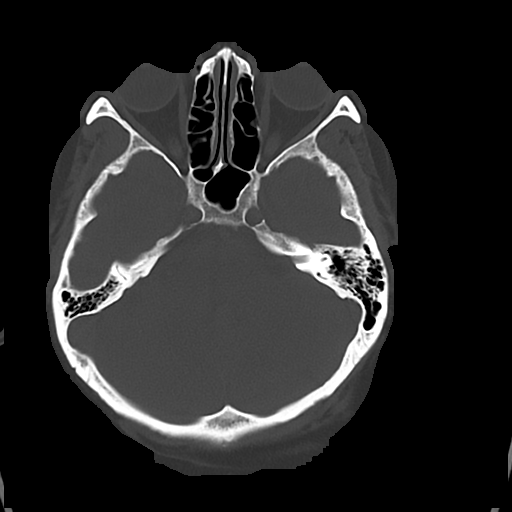
[im 24/78  bone]
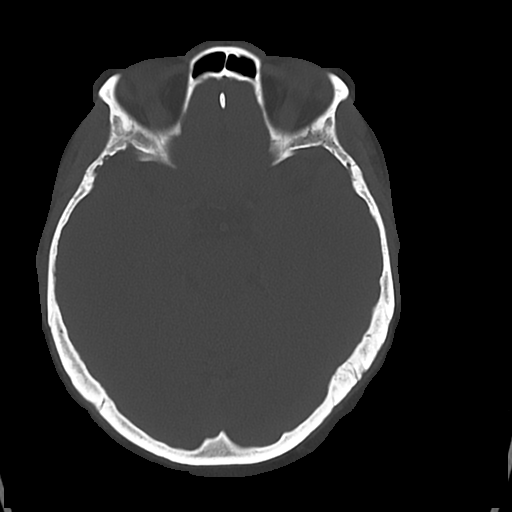

[Series 3: head wo · axial · 0.40mm/px · z∈[-113,+2]mm · 7 of 31 slices shown, 9 images]
[im 4/31  brain]
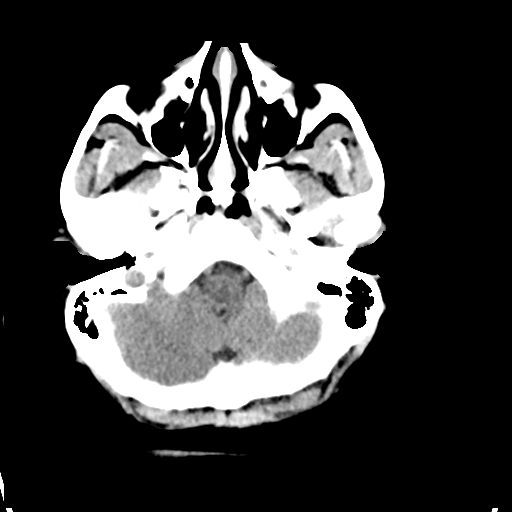
[im 4/31  bone]
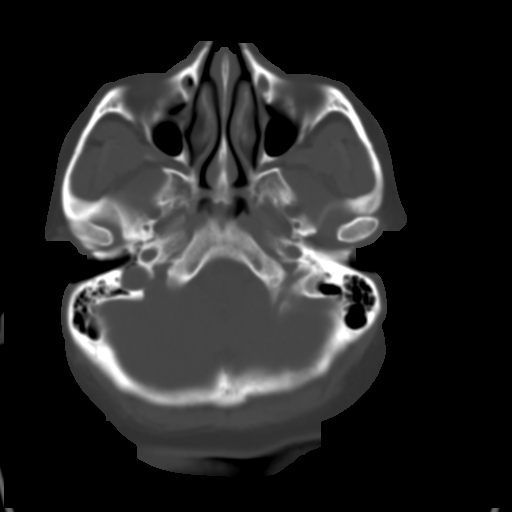
[im 8/31  brain]
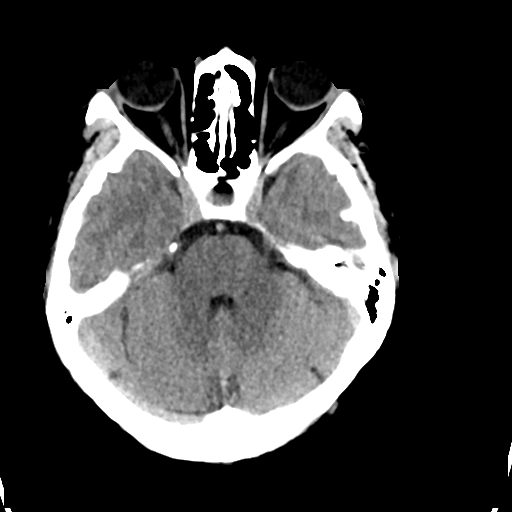
[im 12/31  brain]
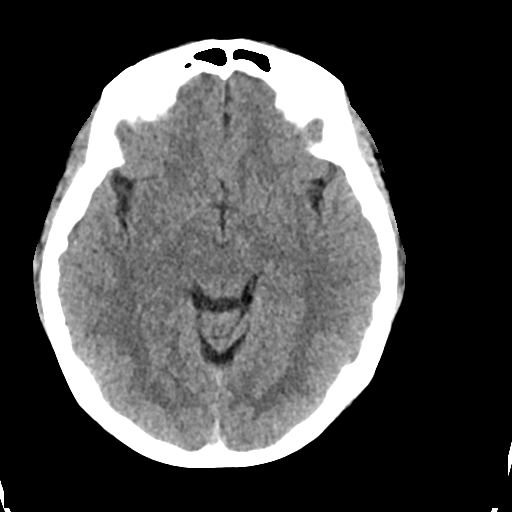
[im 16/31  brain]
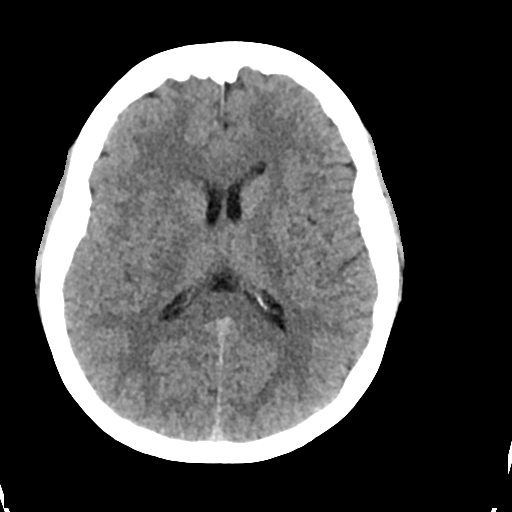
[im 19/31  brain]
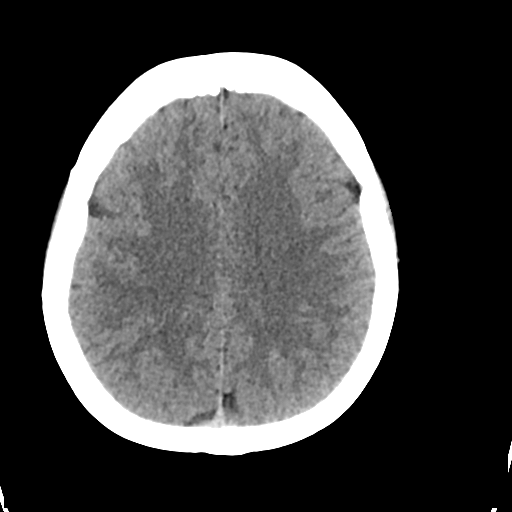
[im 19/31  bone]
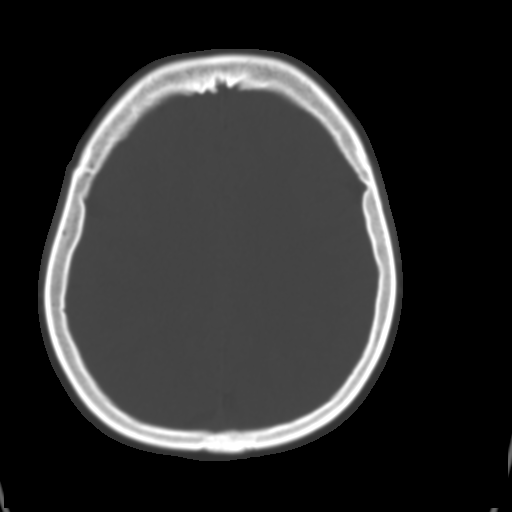
[im 23/31  brain]
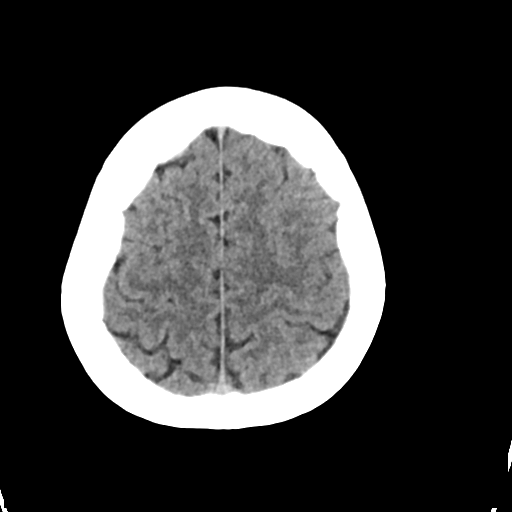
[im 27/31  brain]
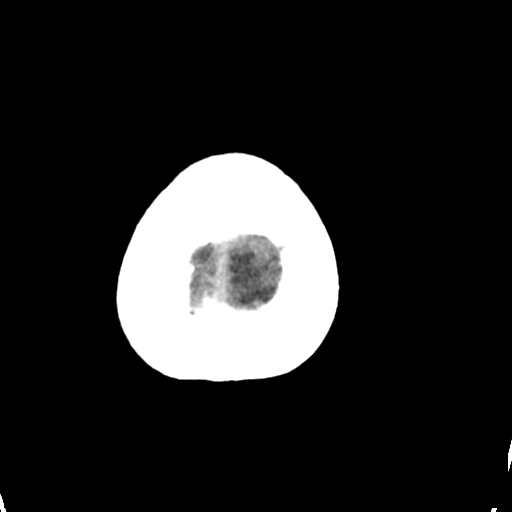

[Series 4: cor soft · coronal · 0.29mm/px · 3 of 65 slices shown]
[im 22/65  brain]
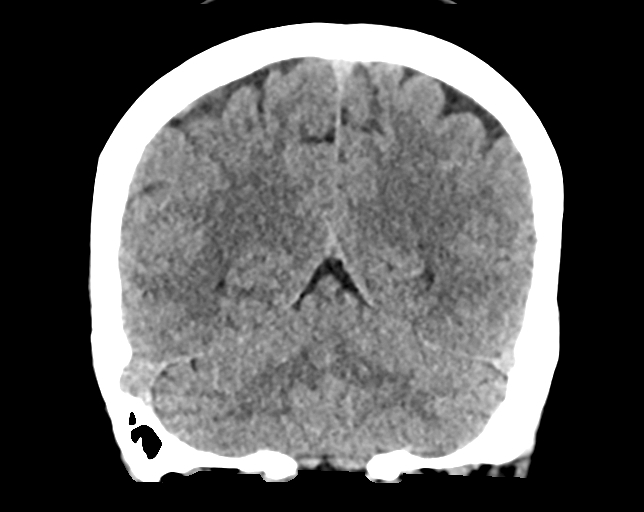
[im 29/65  brain]
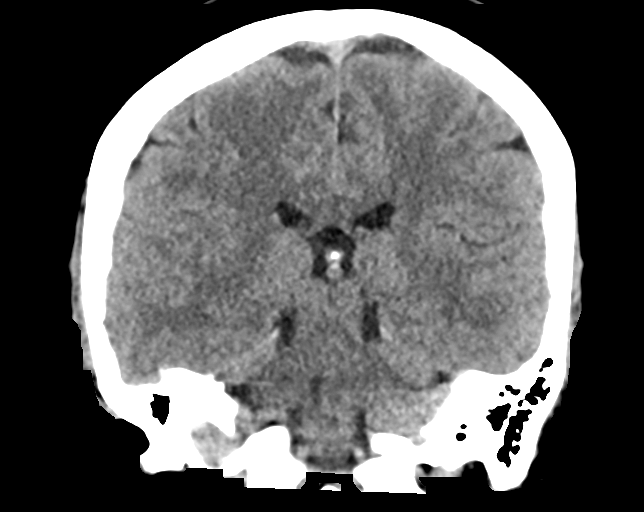
[im 36/65  brain]
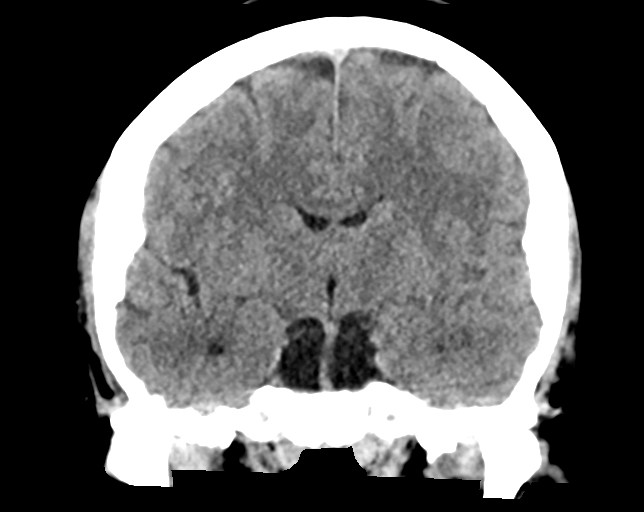

[Series 5: sag soft · sagittal · 0.29mm/px · 3 of 61 slices shown]
[im 21/61  brain]
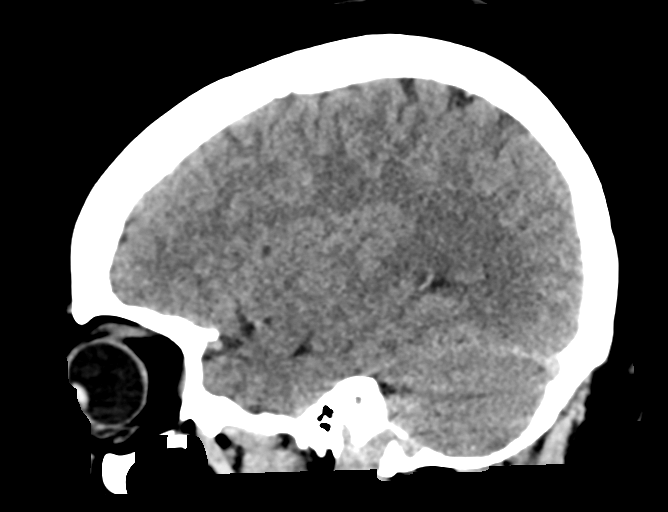
[im 31/61  brain]
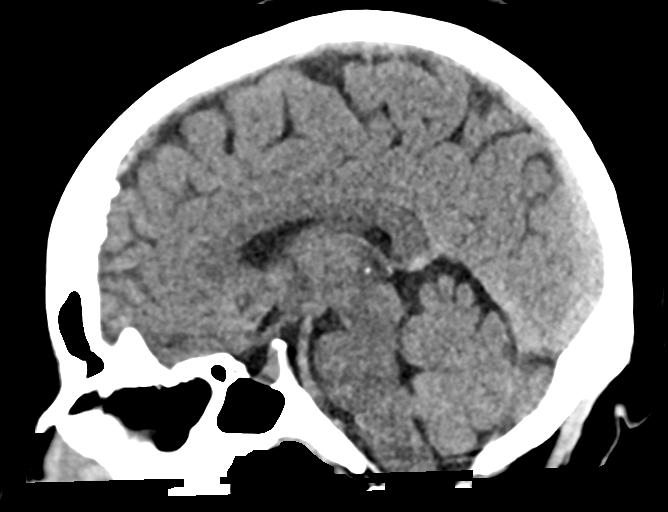
[im 41/61  brain]
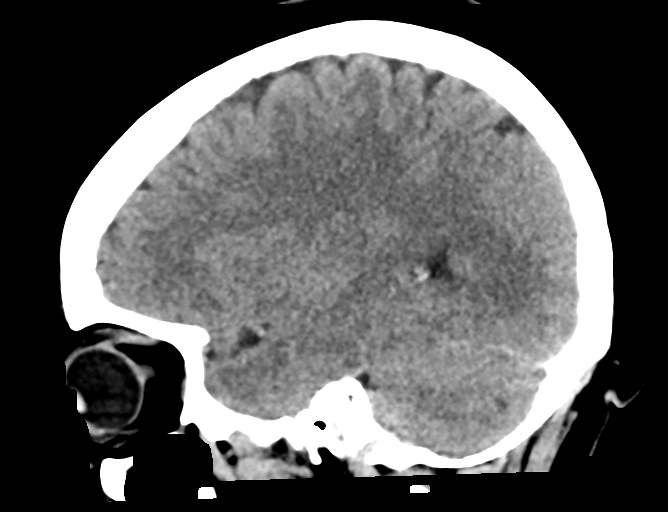

[16 of 47 positions shown; findings below may reference images not displayed]

FINDINGS: Brain: No acute intracranial findings are seen. Ventricles are not
dilated. There are no signs of bleeding. There are no epidural or
subdural fluid collections.

Vascular: Unremarkable.

Skull: No fracture is seen.

Sinuses/Orbits: There is mucous retention cyst in the left maxillary
sinus. There is mild mucosal thickening in the ethmoid sinus.

Other: None
IMPRESSION: No acute intracranial findings are seen in the noncontrast CT brain.

Chronic ethmoid and left maxillary sinusitis.

## 2024-02-14 ENCOUNTER — Ambulatory Visit (LOCAL_COMMUNITY_HEALTH_CENTER): Payer: Self-pay

## 2024-02-14 DIAGNOSIS — Z719 Counseling, unspecified: Secondary | ICD-10-CM

## 2024-02-14 DIAGNOSIS — Z23 Encounter for immunization: Secondary | ICD-10-CM

## 2024-02-14 NOTE — Progress Notes (Signed)
 Pt in nurse clinic with daughter requesting vaccines as needed for Immigration. Pt meets criteria for free Twinrix per ACIP guidelines, free Varicella vaccine was approved by [employer] PAP, pt paid Flu and Polio vaccines. Given VIS, administered vaccines, tolerated well. Given [facility] copies, informed of the next vaccines due, pt verbalized understanding. M.Diella Gillingham, LPN.
